# Patient Record
Sex: Male | Born: 1995 | Race: White | Hispanic: No | Marital: Single | Smoking: Never smoker
Health system: Southern US, Community
[De-identification: ages and names within clinical notes are randomized; demographics above are authoritative.]

## PROBLEM LIST (undated history)

## (undated) DIAGNOSIS — IMO0002 Reserved for concepts with insufficient information to code with codable children: Secondary | ICD-10-CM

## (undated) DIAGNOSIS — K219 Gastro-esophageal reflux disease without esophagitis: Secondary | ICD-10-CM

## (undated) DIAGNOSIS — I861 Scrotal varices: Secondary | ICD-10-CM

## (undated) DIAGNOSIS — L709 Acne, unspecified: Secondary | ICD-10-CM

## (undated) DIAGNOSIS — B019 Varicella without complication: Secondary | ICD-10-CM

## (undated) DIAGNOSIS — Q600 Renal agenesis, unilateral: Secondary | ICD-10-CM

## (undated) HISTORY — DX: Renal agenesis, unilateral: Q60.0

## (undated) HISTORY — DX: Reserved for concepts with insufficient information to code with codable children: IMO0002

## (undated) HISTORY — PX: TYMPANOSTOMY TUBE PLACEMENT: SHX32

## (undated) HISTORY — DX: Acne, unspecified: L70.9

## (undated) HISTORY — DX: Gastro-esophageal reflux disease without esophagitis: K21.9

## (undated) HISTORY — DX: Varicella without complication: B01.9

## (undated) HISTORY — DX: Scrotal varices: I86.1

## (undated) HISTORY — PX: KIDNEY SURGERY: SHX687

---
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
## 2017-03-31 ENCOUNTER — Ambulatory Visit (INDEPENDENT_AMBULATORY_CARE_PROVIDER_SITE_OTHER): Payer: BLUE CROSS/BLUE SHIELD | Admitting: *Deleted

## 2017-03-31 ENCOUNTER — Ambulatory Visit: Payer: BLUE CROSS/BLUE SHIELD

## 2017-03-31 DIAGNOSIS — Z23 Encounter for immunization: Secondary | ICD-10-CM

## 2018-02-24 ENCOUNTER — Ambulatory Visit (INDEPENDENT_AMBULATORY_CARE_PROVIDER_SITE_OTHER): Payer: BLUE CROSS/BLUE SHIELD

## 2018-02-24 ENCOUNTER — Encounter: Payer: Self-pay | Admitting: Family Medicine

## 2018-02-24 DIAGNOSIS — Z23 Encounter for immunization: Secondary | ICD-10-CM

## 2018-05-17 ENCOUNTER — Ambulatory Visit (INDEPENDENT_AMBULATORY_CARE_PROVIDER_SITE_OTHER): Payer: BLUE CROSS/BLUE SHIELD | Admitting: Family Medicine

## 2018-05-17 ENCOUNTER — Encounter: Payer: Self-pay | Admitting: Family Medicine

## 2018-05-17 VITALS — BP 118/82 | HR 92 | Temp 98.0°F | Ht 73.0 in | Wt 173.4 lb

## 2018-05-17 DIAGNOSIS — I861 Scrotal varices: Secondary | ICD-10-CM

## 2018-05-17 DIAGNOSIS — Q600 Renal agenesis, unilateral: Secondary | ICD-10-CM | POA: Diagnosis not present

## 2018-05-17 DIAGNOSIS — M545 Low back pain, unspecified: Secondary | ICD-10-CM

## 2018-05-17 DIAGNOSIS — Z0000 Encounter for general adult medical examination without abnormal findings: Secondary | ICD-10-CM

## 2018-05-17 DIAGNOSIS — G8929 Other chronic pain: Secondary | ICD-10-CM

## 2018-05-17 DIAGNOSIS — IMO0002 Reserved for concepts with insufficient information to code with codable children: Secondary | ICD-10-CM

## 2018-05-17 NOTE — Progress Notes (Signed)
Phone: [phone]  Subjective:  Patient presents today for their annual physical. Chief complaint-noted.   See problem oriented charting- ROS- full  review of systems was completed and negative except for: intermittent back pain radiating into lower legs  The following were reviewed and entered/updated in epic: Past Medical History:  Diagnosis Date  . Acne    06/06/14- moderate inflammatory started on doxycycline 163m BID, previously on adapalene topical retinoid 2014  . GERD (gastroesophageal reflux disease)    2008-2010 diagnosis.   . 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 did not play contact sports.   . Varicella    08/04/1998 per medical history  . Varicocele    L noted 2014. could affect fertility. Treatment is indicated for boys who demonstrate retarded growth of the affected (usually, left) testis and in young men who develop testicular atrophy   Patient Active Problem List   Diagnosis Date Noted  . Solitary kidney     Priority: Medium  . Acne     Priority: Low  . Varicocele     Priority: Low   Past Surgical History:  Procedure Laterality Date  . 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   . TYMPANOSTOMY TUBE PLACEMENT     Dr. BJanace Hoard6 months old    Family History  Problem Relation Age of Onset  . Melanoma Mother   . Ulcerative colitis Father   . Pancreatic disease Father        noncancerous    Medications- reviewed and updated No current outpatient medications on file.   No current facility-administered medications for this visit.     Allergies-reviewed and updated No Known Allergies  Social History   Social History Narrative   Family: Single. Lives at home with mom and dad. [facility] [employer] [employer] [facility] [employer]    Dad works for [employer]in 2020- did [employer] a year   MMudloggerand society degree under sociology.    Work: Goes to [employer].    Plays golf      Hobbies: fishing,  basketball with young life    Objective: BP 118/82 (BP Location: Left Arm, Patient Position: Sitting, Cuff Size: Large)   Pulse 92   Temp 98 F (36.7 C) (Oral)   Ht 6' 1"  (1.854 m)   Wt 173 lb 6.4 oz (78.7 kg)   SpO2 98%   BMI 22.88 kg/m  Gen: NAD, resting comfortably HEENT: Mucous membranes are moist. Oropharynx normal Neck: no thyromegaly CV: RRR no murmurs rubs or gallops Lungs: CTAB no crackles, wheeze, rhonchi Abdomen: soft/nontender/nondistended/normal bowel sounds. No rebound or guarding.  Ext: no edema Skin: warm, dry Neuro: grossly normal, moves all extremities, PERRLA GU: normal testicular exam, fuller left scrotum with known hydrocele Straight leg raise on both sides produces pain radiating into right leg   Assessment/Plan:  23y.o. male presenting for annual physical.  Health Maintenance counseling: 1. Anticipatory guidance: Patient counseled regarding regular dental exams -q6 months, eye exams -no issues,  avoiding smoking and second hand smoke, limiting alcohol to 2 beverages per day - technically not supposed to drink as underage- discussed safe usage.   2. Risk factor reduction:  Advised patient of need for regular exercise and diet rich and fruits and vegetables to reduce risk of heart attack and stroke. Exercise- has been down this fall but hopes to improve on that this new year. Diet-reasonably balance ddiet for being  in college.  Wt Readings from Last 3 Encounters:  05/17/18 173 lb 6.4 oz (78.7 kg)  01/04/15 162 lb (73.5 kg) (76 %, Z= 0.69)*  3. Immunizations/screenings/ancillary studies Immunization History  Administered Date(s) Administered  . DTaP 01/25/1998, 03/14/1998, 05/15/1998, 02/11/1999, 10/11/2002  . Hepatitis A 12/02/2006, 09/05/2007  . Hepatitis B 05/15/1998, 08/29/1998, 11/19/1998  . HiB (PRP-OMP) 01/25/1998, 03/14/1998, 02/11/1999  . IPV 01/25/1998, 03/14/1998, 08/29/1998, 10/11/2002  . Influenza,inj,Quad PF,6+ Mos 03/31/2017, 02/24/2018    . Influenza-Unspecified 04/10/2010  . MMR 11/19/1998, 10/11/2002  . Meningococcal Conjugate 06/11/2009, 01/04/2015  . Pneumococcal-Unspecified 05/23/1999, 11/06/1999  . Tdap 12/14/2008   Health Maintenance Due  Topic Date Due  . HIV Screening  11/03/2012  4. Prostate cancer screening- no family history, start at age 31 5. Colon cancer screening - no family history, start at age 51 6. Skin cancer screening/prevention-mother with melanoma history- he does not see dermatology-advised regular sunscreen use. Denies worrisome, changing, or new skin lesions.  7. Testicular cancer screening- advised monthly self exams - we also examined today 8. STD screening- patient opts out. Using protection every time he is active.  9.  Never smoker. Sparing marijuana in HS not in college. Drinks alcohol perhaps once a week. No drinking and driving.   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 Recommended updating labs- he declines but agrees to next year- cbc, cmp. Screen lipids next year. HIV screen next year.   Patient with known left varicocele- no obvious delayed testicular growth or testicular atrophy on exam today  Dealing with fair amount of back pain- plays a lot of golf. Started in left low back- he is a lefty. Felt it shooting down into left leg now in last 6 months can go down into both legs. Has gotten into a stretching regimen 20 mins he feels way better- resolution of pain if does full video for a week. He does a golf stretching video- uncle had the same thing and patient states almost immediately felt better. This morning woke up and had pain radiating into both legs though as has not been consistent over the break. With back pain considered HLA B27 given how young he is as well as lumbar films. Will defer for now- he agrees to see Dr. Paulla Fore  1 year CPE Declines labs today  Return  precautions advised.  Garret Reddish, MD

## 2018-05-17 NOTE — Patient Instructions (Addendum)
Back and GU exam   Please schedule a visit with our  sports medicine physician Dr. Berline Chough before you leave at the check out desk so he can further evaluate your back pain. I would like for you to sit down and chat with Dr. Berline Chough given chronicity of your pain. No rush on this but I would like his opinion. Try to be consistent with your stretching since that seems to help  You agreed to bloodwork next year- can go ahead and schedule a physical for next years Christmas break if you would like

## 2018-06-14 ENCOUNTER — Ambulatory Visit (INDEPENDENT_AMBULATORY_CARE_PROVIDER_SITE_OTHER): Payer: BLUE CROSS/BLUE SHIELD

## 2018-06-14 ENCOUNTER — Encounter: Payer: Self-pay | Admitting: Sports Medicine

## 2018-06-14 ENCOUNTER — Ambulatory Visit: Payer: BLUE CROSS/BLUE SHIELD | Admitting: Sports Medicine

## 2018-06-14 VITALS — BP 108/78 | HR 82 | Ht 75.0 in | Wt 171.6 lb

## 2018-06-14 DIAGNOSIS — M9908 Segmental and somatic dysfunction of rib cage: Secondary | ICD-10-CM | POA: Diagnosis not present

## 2018-06-14 DIAGNOSIS — M9903 Segmental and somatic dysfunction of lumbar region: Secondary | ICD-10-CM | POA: Diagnosis not present

## 2018-06-14 DIAGNOSIS — M24551 Contracture, right hip: Secondary | ICD-10-CM

## 2018-06-14 DIAGNOSIS — M5441 Lumbago with sciatica, right side: Secondary | ICD-10-CM

## 2018-06-14 DIAGNOSIS — R29898 Other symptoms and signs involving the musculoskeletal system: Secondary | ICD-10-CM | POA: Diagnosis not present

## 2018-06-14 DIAGNOSIS — G8929 Other chronic pain: Secondary | ICD-10-CM

## 2018-06-14 DIAGNOSIS — M9904 Segmental and somatic dysfunction of sacral region: Secondary | ICD-10-CM

## 2018-06-14 DIAGNOSIS — M9905 Segmental and somatic dysfunction of pelvic region: Secondary | ICD-10-CM

## 2018-06-14 DIAGNOSIS — M9902 Segmental and somatic dysfunction of thoracic region: Secondary | ICD-10-CM

## 2018-06-14 DIAGNOSIS — Q675 Congenital deformity of spine: Secondary | ICD-10-CM | POA: Diagnosis not present

## 2018-06-14 DIAGNOSIS — M9901 Segmental and somatic dysfunction of cervical region: Secondary | ICD-10-CM

## 2018-06-14 DIAGNOSIS — M5442 Lumbago with sciatica, left side: Secondary | ICD-10-CM | POA: Diagnosis not present

## 2018-06-14 DIAGNOSIS — M48061 Spinal stenosis, lumbar region without neurogenic claudication: Secondary | ICD-10-CM | POA: Diagnosis not present

## 2018-06-14 MED ORDER — GABAPENTIN 300 MG PO CAPS
ORAL_CAPSULE | ORAL | 1 refills | Status: DC
Start: 2018-06-14 — End: 2020-05-24

## 2018-06-14 NOTE — Patient Instructions (Addendum)
Also check out [employer]" which is a program developed by Dr. Myles Lipps.   There are links to a couple of his YouTube Videos below and I would like to see you performing one of his videos 5-6 days per week.  It is best to do these exercises first thing in the morning.  They will give you a good jumpstart here today and start normalizing the way you move.  A good intro video is: "Independence from Pain 7-minute Video" - [email or web address]   A more advanced video is: Scientist, research (medical) original 12 minutes" - [email or web address]  Exercises that focus more on the neck are as below: Dr. Derrill Kay with [employer] [facility] teaching neck and shoulder details Part 1 - https://[email or web address].[email or web address] Part 2 Dr. Derrill Kay with [facility] quick routine to practice daily - https://[email or web address].be/[email or web address]  Do not try to attempt the entire video when first beginning.  Try breaking of each exercise that he goes into shorter segments.  In other words, if they perform an exercise for 45 seconds, start with 15 seconds and rest and then resume when they begin the new activity.  If you work your way up to being able to do these videos without having to stop, I expect you will see significant improvements in your pain.  If you enjoy his videos and would like to find out more you can look on his website: [email or web address].  He has a workout streaming option as well as a DVD set available for purchase.  Amazon has the best price for his DVDs.     Please perform the exercise program that we have prepared for you and gone over in detail on a daily basis.  In addition to the handout you were provided you can access your program through: [email or web address]   Your unique program code is: [email or web address]

## 2018-06-14 NOTE — Progress Notes (Signed)
Brian Peters. Brian [facility] [phone]  Brian Peters - 23 y.o. male MRN [id number]  Date of birth: May 17, 1995  Visit Date: June 14, 2018  PCP: Shelva Majestic, MD   Referred by: Shelva Majestic, MD  SUBJECTIVE:  Chief Complaint  Patient presents with  . Lower Back - Initial Assessment    Golfer. Radiates to B LE. Improves with stretching.   . Initial Assessment    Referred by Dr. Tana Conch.     HPI: Patient presents with over a year of worsening left followed by right posterior leg and low back pain.  He has had symptoms that initially radiated from his left buttock into the left heel and now going from the right buttock to the right heel.  This is been on and off for over a year.  He has some nighttime disturbances due to this.  His symptoms are described as a sharp, constant pain.  He is an avid golfer is left-handed and reported playing golf almost daily over the summer.  He is in school at [employer] and plays 1-2 times a week during school year.  He has done some home exercise videos with his uncle who is an avid golfer as well.  The videos were golf specific and this did seem to help initially however unfortunately over the past 2 weeks he is actually had worsening of his symptoms and has a difficult time performing the videos due to the pain.  REVIEW OF SYSTEMS: Denies fevers, chills, recent weight gain or weight loss.  No night sweats. No significant nighttime awakenings due to this issue. Pt denies any change in bowel or bladder habits, muscle weakness, numbness or falls associated with this pain. Occasional nighttime awakening due to pain and discomfort but this is infrequent. Otherwise 12 point review of systems performed and is negative    HISTORY:  Prior history reviewed and updated per electronic medical record.  Patient Active Problem List   Diagnosis Date Noted  . 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 Acne     06/06/14- moderate inflammatory started on doxycycline 100mg  BID, previously on adapalene topical retinoid 2014   . Varicocele     L noted 2014. could affect fertility. Treatment is indicated for boys who demonstrate retarded growth of the affected (usually, left) testis and in young men who develop testicular atrophy   . Solitary kidney      did not play contact sports.   01/17/1998- "left ectopic nonfunctioning kidney withgrade IV vesicureteral reflux to the left kidney. Excision of ureterocele and left nephrectomy.   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     Social History   Occupational History  . Not on file  Tobacco Use  . Smoking status: Current Some Day Smoker    Types: E-cigarettes, Cigars  . Smokeless tobacco: Never Used  . Tobacco comment: socially  Substance and Sexual Activity  . Alcohol use: Yes    Alcohol/week: 0.0 standard drinks  . Drug use: No  . Sexual activity: Not on file   Social History   Social History Narrative   Family: Single. Lives at home with mom and dad. Sister at [employer].    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    Plays golf      Hobbies: fishing, basketball with young life    Past Medical History:  Diagnosis Date  . Acne    06/06/14- moderate inflammatory started on doxycycline 100mg  BID, previously on adapalene topical retinoid 2014  . GERD (gastroesophageal reflux disease)    2008-2010 diagnosis.   . 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 did not play contact sports.   . Varicella    08/04/1998 per medical history  . Varicocele    L noted 2014. could affect fertility. Treatment is indicated for boys who demonstrate retarded growth of the affected (usually, left) testis and in young men who develop  testicular atrophy     Past Surgical History:  Procedure Laterality Date  . 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   . TYMPANOSTOMY TUBE PLACEMENT     Dr. Jearld Fenton 6 months old    family history includes Melanoma in his mother; Pancreatic disease in his father; Ulcerative colitis in his father. No results for input(s): HGBA1C, LABURIC, CREATININE, CALCIUM, MG, AST, ALT, CKTOTAL, TSH in the last 8760 hours.  OBJECTIVE:  VS:  HT:6\' 3"  (190.5 cm)   WT:171 lb 9.6 oz (77.8 kg)  BMI:21.45    BP:108/78  HR:82bpm  TEMP: ( )  RESP:100 %   PHYSICAL EXAM: Well-developed, Well-nourished and In no acute distress  Pupils are equal., EOM intact without nystagmus. and No scleral icterus.  Alert & appropriately interactive. and Not depressed or anxious appearing.  Warm and well perfused   His back is well aligned however he has marked amount of bar lordosis.  He has a markedly positive straight leg raise on the right with a positive brokerage stretch test on the right.  Slightly positive contralateral left straight leg raise.  There is marked tightness and right hip compared to his left.  There is no significant weakness and is able to heel toe walk without difficulty.   ASSESSMENT:   1. Chronic bilateral low back pain with bilateral sciatica   2. Congenital anomaly of lumbar spine      PROCEDURES:  PROCEDURE NOTE : OSTEOPATHIC MANIPULATION The decision today to treat with Osteopathic Manipulative Therapy (OMT) was based on physical exam findings. Verbal consent was obtained following a discussion with the patient regarding the of risks, benefits and potential side effects, including an acute pain flare,post manipulation soreness and need for repeat treatments. Additionally, we specifically discussed the minimal risk of  injury to neurovascular structures associated with Cervical manipulation.   Contraindications to OMT: NONE  Manipulation was performed as  below: [facility] & Osteopathic Exam Findings  CERVICAL SPINE: OA - rotated right C6 Flexed, rotated RIGHT, sidebent LEFT THORACIC SPINE:  T6 - 9 Neutral, rotated RIGHT, sidebent LEFT RIBS:  Rib 7 Left  Posterior LUMBAR SPINE:  L4 FRS RIGHT (Flexed, Rotated & Sidebent) PELVIS:  Right psoas spasm Right anterior innonimate SACRUM:  L on L sacral torsion   OMT Techniques Used  HVLA muscle energy myofascial release HVLA - Long Lever   The patient tolerated the treatment well and reported Improved symptoms following treatment today. Patient was given medications, exercises, stretches and lifestyle modifications per AVS and verbally.    PROCEDURE NOTE: THERAPEUTIC EXERCISES (97110) 15 minutes spent for Therapeutic exercises as below and as referenced in the AVS.  This included exercises focusing on stretching, strengthening, with significant focus on eccentric  aspects.   Proper technique shown and discussed handout in great detail with ATC.  All questions were discussed and answered.   Long term goals include an improvement in range of motion, strength, endurance as well as avoiding reinjury. Frequency of visits is one time as determined during today's  office visit. Frequency of exercises to be performed is as per handout.  EXERCISES REVIEWED: Derrill KayGoodman Exercises Pelvic recruitment/tilting Hip ABduction strengthening with focus on Glute Medius Recruitment Hip flexor stretching        PLAN:  Pertinent additional documentation may be included in corresponding procedure notes, imaging studies, problem based documentation and patient instructions.  Congenital anomaly of lumbar spine This does cause a slight increased mobility in his lumbar spine and further stabilizing exercises will be of benefit as reviewed today.  Given the underlying anxiety as well as slight neural tension sign will benefit from gabapentin to allow increased physical activity.  Functional low back pain  with radicular symptoms consistent with anterior chain tightness and hypomobility of the lumbar spine which is exacerbated by the underlying congenital L6 vertebrae.  Links to [employer] Training videos provided today per Patient Instructions.  These exercises were developed by Myles LippsEric Goodman, DC with a strong emphasis on core neuromuscular reducation and postural realignment through body-weight exercises.  and Home Therapeutic exercises prescribed today per procedure note.  Discussed the underlying features of tight hip flexors leading to crouched, fetal like position that results in spinal column compression.  Including lumbar hyperflexion with hypermobility, thoracic flexion with restrictive rotation and cervical lordosis reversal.   Osteopathic manipulation was performed today based on physical exam findings.  Patient was counseled on the purpose and expected outcome of osteopathic manipulation and understands that a single treatment may not provide permanent long lasting relief.  They understand that home therapeutic exercises are critical part of the healing/treatment process and will continue with self treatment between now and their next visit as outlined.  The patient understands that the frequency of visits is meant to provide a stimulus to promote the body's own ability to heal and is not meant to be the sole means for improvement in their symptoms.  Activity modifications and the importance of avoiding exacerbating activities (limiting pain to no more than a 4 / 10 during or following activity) recommended and discussed.  Discussed red flag symptoms that warrant earlier emergent evaluation and patient voices understanding.   Meds ordered this encounter  Medications  . gabapentin (NEURONTIN) 300 MG capsule    Sig: Start with 1 tab po qhs X 1 week, then increase to 1 tab po bid X 1 week then 1 tab po tid prn    Dispense:  90 capsule    Refill:  1   Lab Orders  No laboratory test(s)  ordered today    Imaging Orders     DG Lumbar Spine Complete Referral Orders  No referral(s) requested today    At follow up will plan to consider: repeat osteopathic manipulation   Return in about 3 weeks (around 07/05/2018).          Andrena MewsMichael D Kaevon Cotta, DO    [place] [facility] Physician

## 2018-06-14 NOTE — Assessment & Plan Note (Signed)
This does cause a slight increased mobility in his lumbar spine and further stabilizing exercises will be of benefit as reviewed today.

## 2018-07-05 ENCOUNTER — Encounter: Payer: Self-pay | Admitting: Sports Medicine

## 2018-07-05 ENCOUNTER — Ambulatory Visit: Payer: BLUE CROSS/BLUE SHIELD | Admitting: Sports Medicine

## 2018-07-05 VITALS — BP 112/78 | HR 82 | Ht 75.0 in | Wt 170.4 lb

## 2018-07-05 DIAGNOSIS — M24551 Contracture, right hip: Secondary | ICD-10-CM | POA: Diagnosis not present

## 2018-07-05 DIAGNOSIS — Q675 Congenital deformity of spine: Secondary | ICD-10-CM

## 2018-07-05 DIAGNOSIS — R29898 Other symptoms and signs involving the musculoskeletal system: Secondary | ICD-10-CM | POA: Diagnosis not present

## 2018-07-05 DIAGNOSIS — M9908 Segmental and somatic dysfunction of rib cage: Secondary | ICD-10-CM

## 2018-07-05 DIAGNOSIS — G8929 Other chronic pain: Secondary | ICD-10-CM

## 2018-07-05 DIAGNOSIS — M5442 Lumbago with sciatica, left side: Secondary | ICD-10-CM

## 2018-07-05 DIAGNOSIS — M9902 Segmental and somatic dysfunction of thoracic region: Secondary | ICD-10-CM

## 2018-07-05 DIAGNOSIS — M9903 Segmental and somatic dysfunction of lumbar region: Secondary | ICD-10-CM

## 2018-07-05 DIAGNOSIS — M5441 Lumbago with sciatica, right side: Secondary | ICD-10-CM

## 2018-07-05 DIAGNOSIS — M9901 Segmental and somatic dysfunction of cervical region: Secondary | ICD-10-CM

## 2018-07-05 DIAGNOSIS — M9905 Segmental and somatic dysfunction of pelvic region: Secondary | ICD-10-CM

## 2018-07-05 NOTE — Progress Notes (Signed)
Brian Peters. Brian Peters [facility] [phone]  Brian Peters - 23 y.o. male MRN [id number]  Date of birth: 12-18-1995  Visit Date: July 05, 2018  PCP: Shelva Majestic, MD   Referred by: Shelva Majestic, MD  SUBJECTIVE:  Chief Complaint  Patient presents with  . Lower Back - Follow-up  . Right Hip - Follow-up    HPI: Patient is here for follow-up of the above symptoms.  Ultimately he is having marked improvements in symptoms he was even able to go play 3 days of golf and [address] [place] without any back pain.  He is having occasional sharp pain that comes and goes.  Does seem to be located in the right lower extremity including into the quad and groin when he is noticing pain.  The gabapentin 300 mg she is taking twice daily up to 3 times daily.  He is performing the [employer] daily as well as the home exercise program previously prescribed diligently.  Overall he is very happy with his progress.   REVIEW OF SYSTEMS: No significant nighttime awakenings due to this issue. Denies fevers, chills, recent weight gain or weight loss.  No night sweats.  Pt denies any change in bowel or bladder habits, muscle weakness, numbness or falls associated with this pain.  HISTORY:  Prior history reviewed and updated per electronic medical record.  Patient Active Problem List   Diagnosis Date Noted  . 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 Acne     06/06/14- moderate inflammatory started on doxycycline 100mg  BID, previously on adapalene topical retinoid 2014   . Varicocele     L noted 2014. could affect fertility. Treatment is indicated for boys who demonstrate retarded growth of the affected (usually, left) testis and in young men who develop testicular atrophy   . Solitary kidney      did not play contact sports.   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 Excision of ureterocele and left nephrectomy.   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     Social History   Occupational History  . Not on file  Tobacco Use  . Smoking status: Current Some Day Smoker    Types: E-cigarettes, Cigars  . Smokeless tobacco: Never Used  . Tobacco comment: socially  Substance and Sexual Activity  . Alcohol use: Yes    Alcohol/week: 0.0 standard drinks  . Drug use: No  . Sexual activity: Not on file   Social History   Social History Narrative   Family: Single. Lives at home with mom and dad. Sister at [facility].    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    Plays golf      Hobbies: fishing, basketball with young life    OBJECTIVE:  VS:  HT:6\' 3"  (190.5 cm)   WT:170 lb 6.4 oz (77.3 kg)  BMI:21.3    BP:112/78  HR:82bpm  TEMP: ( )  RESP:98 %   PHYSICAL EXAM: Adult male. No acute distress.  Alert and appropriate. Good cervical and lumbar range of motion although there are functional limitations. Negative straight leg raise. Upper extremity lower extremity strength is 5/5 in all myotomes.  Normal sensation Significant anterior chain dominant posture.    ASSESSMENT:   1. Chronic bilateral low back pain with bilateral sciatica   2. Congenital anomaly of lumbar spine   3. Weakness of right hip   4. Right hip flexor tightness   5. Somatic dysfunction of cervical region   6. Somatic dysfunction of thoracic region   7. Somatic dysfunction of lumbar region   8. Somatic dysfunction of rib cage region   9. Somatic dysfunction of pelvis region     PROCEDURES:  PROCEDURE NOTE : OSTEOPATHIC MANIPULATION The decision today to treat with Osteopathic Manipulative Therapy (OMT) was based on physical exam findings. Verbal consent was obtained following a  discussion with the patient regarding the of risks, benefits and potential side effects, including an acute pain flare,post manipulation soreness and need for repeat treatments. Minimal risk of injury to neurovascular structures with cervical manipulation previously discussed in detailed and reaffirmed today.   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 Patient was given medications, exercises, stretches and lifestyle modifications per AVS and verbally.     PLAN:  Pertinent additional documentation may be included in corresponding procedure notes, imaging studies, problem based documentation and patient instructions.  No problem-specific Assessment & Plan notes found for this encounter.   Overall great improvement.  He will continue with the gabapentin at this time until the end of the semester.  Hopefully will be able to wean him off of this once his symptoms are more stable.  He does report feeling markedly better overall.  Continue previously prescribed home exercise program.   Discussed the underlying features of tight hip flexors leading to crouched, fetal like position that results in spinal column compression.  Including lumbar hyperflexion with hypermobility, thoracic flexion with restrictive rotation and cervical lordosis reversal.   Osteopathic manipulation was performed today based on physical  exam findings.  Patient has responded well to osteopathic manipulation previously the prior manipulation did not provide permanent long lasting relief.  The patient does feel as though there was significant benefit to the prior manipulation and they wished for repeat manipulation today.  They understand that home therapeutic exercises are critical part of the healing/treatment process and will continue with self treatment between now and their next visit as outlined.  The patient understands that the frequency of visits is meant to provide a stimulus to promote the body's own ability to heal and is not meant to be the sole means for improvement in their symptoms.  Activity modifications and the importance of avoiding exacerbating activities (limiting pain to no more than a 4 / 10 during or following activity) recommended and discussed.  Discussed red flag symptoms that warrant earlier emergent evaluation and patient voices understanding.   No orders of the defined types were placed in this encounter.  Lab Orders  No laboratory test(s) ordered today   Imaging Orders  No imaging studies ordered today  Referral Orders  No referral(s) requested today    At follow up will plan to consider: repeat osteopathic manipulation  Return in about 8 weeks (around 08/30/2018) for consideration of repeat Osteopathic Manipulation.          Andrena Mews, DO    [facility]

## 2018-07-07 ENCOUNTER — Ambulatory Visit: Payer: BLUE CROSS/BLUE SHIELD | Admitting: Family Medicine

## 2018-07-07 ENCOUNTER — Encounter: Payer: Self-pay | Admitting: Family Medicine

## 2018-07-07 VITALS — BP 90/70 | HR 71 | Temp 97.8°F | Ht 75.0 in | Wt 171.0 lb

## 2018-07-07 DIAGNOSIS — Q600 Renal agenesis, unilateral: Secondary | ICD-10-CM | POA: Diagnosis not present

## 2018-07-07 DIAGNOSIS — IMO0002 Reserved for concepts with insufficient information to code with codable children: Secondary | ICD-10-CM

## 2018-07-07 DIAGNOSIS — Z13220 Encounter for screening for lipoid disorders: Secondary | ICD-10-CM | POA: Diagnosis not present

## 2018-07-07 DIAGNOSIS — Z114 Encounter for screening for human immunodeficiency virus [HIV]: Secondary | ICD-10-CM

## 2018-07-07 DIAGNOSIS — N529 Male erectile dysfunction, unspecified: Secondary | ICD-10-CM

## 2018-07-07 DIAGNOSIS — I861 Scrotal varices: Secondary | ICD-10-CM

## 2018-07-07 LAB — COMPREHENSIVE METABOLIC PANEL
ALT: 12 U/L (ref 0–53)
AST: 15 U/L (ref 0–37)
Albumin: 5.1 g/dL (ref 3.5–5.2)
Alkaline Phosphatase: 59 U/L (ref 39–117)
BUN: 17 mg/dL (ref 6–23)
CALCIUM: 10 mg/dL (ref 8.4–10.5)
CHLORIDE: 101 meq/L (ref 96–112)
CO2: 27 meq/L (ref 19–32)
CREATININE: 1.07 mg/dL (ref 0.40–1.50)
GFR: 87.52 mL/min (ref >60.00)
Glucose, Bld: 90 mg/dL (ref 70–99)
Potassium: 4 mEq/L (ref 3.5–5.1)
SODIUM: 137 meq/L (ref 135–145)
Total Bilirubin: 0.8 mg/dL (ref 0.2–1.2)
Total Protein: 7.8 g/dL (ref 6.0–8.3)

## 2018-07-07 LAB — LIPID PANEL
CHOL/HDL RATIO: 2
Cholesterol: 162 mg/dL (ref 0–200)
HDL: 68.4 mg/dL (ref >39.00)
LDL CALC: 70 mg/dL (ref 0–99)
NonHDL: 93.78
TRIGLYCERIDES: 117 mg/dL (ref 0.0–149.0)
VLDL: 23.4 mg/dL (ref 0.0–40.0)

## 2018-07-07 LAB — CBC
HCT: 46.2 % (ref 39.0–52.0)
Hemoglobin: 16.1 g/dL (ref 13.0–17.0)
MCHC: 34.8 g/dL (ref 30.0–36.0)
MCV: 83.8 fl (ref 78.0–100.0)
PLATELETS: 161 10*3/uL (ref 150.0–400.0)
RBC: 5.52 Mil/uL (ref 4.22–5.81)
RDW: 13.5 % (ref 11.5–14.6)
WBC: 3.4 10*3/uL — ABNORMAL LOW (ref 4.5–10.5)

## 2018-07-07 NOTE — Progress Notes (Signed)
Phone [phone]   Subjective:  Brian Peters is a 23 y.o. year old very pleasant male patient who presents for/with See problem oriented charting ROS- normal libido, softer and more difficult to achieve erections . No abdominal pain, chest pain, shortness of breath.   Past Medical History-  Patient Active Problem List   Diagnosis Date Noted  . Solitary kidney     Priority: Medium  . Acne     Priority: Low  . Varicocele     Priority: Low  . Congenital anomaly of lumbar spine 06/14/2018    Medications- reviewed and updated Current Outpatient Medications  Medication Sig Dispense Refill  . gabapentin (NEURONTIN) 300 MG capsule Start with 1 tab po qhs X 1 week, then increase to 1 tab po bid X 1 week then 1 tab po tid prn 90 capsule 1   No current facility-administered medications for this visit.      Objective:  BP 90/70 (BP Location: Left Arm, Patient Position: Sitting, Cuff Size: Normal)   Pulse 71   Temp 97.8 F (36.6 C) (Oral)   Ht 6' 3"  (1.905 m)   Wt 171 lb (77.6 kg)   SpO2 97%   BMI 21.37 kg/m  Gen: NAD, resting comfortably CV: RRR no murmurs rubs or gallops Lungs: CTAB no crackles, wheeze, rhonchi Abdomen: soft/nontender/nondistended/normal bowel sounds.  Ext: no edema Skin: warm, dry Did not reexamine scrotum today    Assessment and Plan   Back pain S: On last visit " Dealing with fair amount of back pain- plays a lot of golf. Started in left low back- he is a lefty. Felt it shooting down into left leg now in last 6 months can go down into both legs. Has gotten into a stretching regimen 20 mins he feels way better- resolution of pain if does full video for a week. He does a golf stretching video- uncle had the same thing and patient states almost immediately felt better. This morning woke up and had pain radiating into both legs though as has not been consistent over the break. With back pain considered HLA B27 given how young he is as well as lumbar  films. Will defer for now- he agrees to see Dr. Paulla Fore "  He has seen Dr. Paulla Fore twice and had x-rays "Scoliosis with mild disc space narrowing at L5-6 and L6-S1. No fracture or spondylolisthesis."  He states he is doing well with gabapentin for nerve irriation/damage. Has done really well with manipulation. The chronic pain has significantly improved. Still with occasional issues if pushes hard in sports or stretching. Trying to stay on stretching routine and has 8 week follow up.  A/P: glad he is improving- continue follow up with Dr. Paulla Fore - opted out of hla b27 with labs   Erectile dysfunction Varicocele S: feels like left testicle is floating- doesn't hang as low as the right. This really is not a new thing for him but following issues have made him more concerned...  He states he has not been able to get an erection on command like he previously could. If does get an erection it won't be a full one (some softness). Issues when dating and masturbation. Started shortly after visit in January. Having some morning erections but not as frequent and not nearly as hard as previous. Has been able to ejaculate but is more difficult.   Energy levels normal. Libido is normal.  A/P: Patient denies recent stressors- that honestly is one of the more common  reasons I see for ED in young men. Symptoms started after we discussed varicocele last visit- I suspect this may have caused unconscious mental strain. He is worried about testosterone issues and with less frequent morning erections I think its reasonable to check levels.   We reviewed varicocele could cause low testosterone but usually not low enough to cause significant issues- if his levels are low would refer to urology   Future Appointments  Date Time Provider [place]  05/19/2019  2:20 PM Marin Olp, MD [facility]   Lab/Order associations: fasting- updating labs as previously planned. Labs likely to occur around 9 20 so shortly  after desired for testosterone Solitary kidney - Plan: CBC, Comprehensive metabolic panel  Screening for hyperlipidemia - Plan: Lipid panel  Screening for HIV (human immunodeficiency virus) - Plan: HIV Antibody (routine testing w rflx)  Erectile dysfunction, unspecified erectile dysfunction type - Plan: Testos,Total,Free and SHBG (Male)  Return precautions advised.  Garret Reddish, MD

## 2018-07-07 NOTE — Patient Instructions (Addendum)
Please stop by lab before you go If you do not have mychart- we will call you about results within 5 business days of [place] receiving them.  If you have mychart- we will send your results within 3 business days of [place] receiving them.  If abnormal or we want to clarify a result, we will call or mychart you to make sure you receive the message.  If you have questions or concerns or don't hear within 5-7 days, please send [place] a message or call us.    No changes planned today unless testosterone is low- if it is would likely do a confirmatory test then consider endocrinology- later changed to urology referral

## 2018-07-10 LAB — HIV ANTIBODY (ROUTINE TESTING W REFLEX): HIV: NONREACTIVE

## 2018-07-10 LAB — TESTOS,TOTAL,FREE AND SHBG (FEMALE)
Free Testosterone: 106.4 pg/mL (ref 35.0–155.0)
Sex Hormone Binding: 25 nmol/L (ref 10–50)
TESTOSTERONE, TOTAL, LC-MS-MS: 514 ng/dL (ref 250–1100)

## 2018-07-13 ENCOUNTER — Encounter: Payer: Self-pay | Admitting: Family Medicine

## 2018-07-20 ENCOUNTER — Telehealth: Payer: Self-pay | Admitting: Family Medicine

## 2018-07-20 NOTE — Telephone Encounter (Signed)
Pt called to get results.  Copied from CRM [id number]. Topic: Quick Communication - Lab Results (Clinic Use ONLY) >> Jul 13, 2018  3:55 PM Sherrin Daisy, CMA wrote: Called patient to inform them of  lab results. When patient returns call, triage nurse may disclose results.

## 2018-07-20 NOTE — Telephone Encounter (Signed)
Pt given lab results and documented in result note.  

## 2019-02-13 ENCOUNTER — Other Ambulatory Visit: Payer: Self-pay

## 2019-02-13 DIAGNOSIS — Z20822 Contact with and (suspected) exposure to covid-19: Secondary | ICD-10-CM

## 2019-02-15 LAB — NOVEL CORONAVIRUS, NAA: SARS-CoV-2, NAA: NOT DETECTED

## 2019-02-28 DIAGNOSIS — U071 COVID-19: Secondary | ICD-10-CM

## 2019-02-28 HISTORY — DX: COVID-19: U07.1

## 2019-05-18 ENCOUNTER — Other Ambulatory Visit: Payer: Self-pay

## 2019-05-19 ENCOUNTER — Encounter: Payer: Self-pay | Admitting: Family Medicine

## 2019-05-19 ENCOUNTER — Ambulatory Visit (INDEPENDENT_AMBULATORY_CARE_PROVIDER_SITE_OTHER): Payer: 59 | Admitting: Family Medicine

## 2019-05-19 ENCOUNTER — Encounter: Payer: BLUE CROSS/BLUE SHIELD | Admitting: Family Medicine

## 2019-05-19 VITALS — BP 118/68 | HR 76 | Temp 98.0°F | Ht 74.0 in | Wt 177.0 lb

## 2019-05-19 DIAGNOSIS — IMO0002 Reserved for concepts with insufficient information to code with codable children: Secondary | ICD-10-CM

## 2019-05-19 DIAGNOSIS — Z0000 Encounter for general adult medical examination without abnormal findings: Secondary | ICD-10-CM

## 2019-05-19 DIAGNOSIS — Q600 Renal agenesis, unilateral: Secondary | ICD-10-CM | POA: Diagnosis not present

## 2019-05-19 NOTE — Patient Instructions (Addendum)
Health Maintenance Due  Topic Date Due  . TETANUS/TDAP - declines today. If he gets a cut/scrape/bee sting- will come in for tetanus shot 12/15/2018   I think getting an updated dermatology visit given mom's history is reasonable- you wanted to chat with parents before doing this.   Recommended follow up: Return in about 1 year (around 05/18/2020) for physical or sooner if needed. we will update labs again at that time due to only having one kidney

## 2019-05-19 NOTE — Progress Notes (Signed)
Phone: [phone]    Subjective:  Patient presents today for their annual physical. Chief complaint-noted.   See problem oriented charting- Review of Systems  Constitutional: Negative.   HENT: Negative.   Eyes: Negative.   Respiratory: Negative.   Cardiovascular: Negative.   Gastrointestinal: Negative.   Genitourinary: Negative.   Musculoskeletal: Negative.   Skin: Negative.   Neurological: Negative.   Endo/Heme/Allergies: Negative.   Psychiatric/Behavioral: Negative.   back pain better  The following were reviewed and entered/updated in epic: Past Medical History:  Diagnosis Date  . Acne    06/06/14- moderate inflammatory started on doxycycline 123m BID, previously on adapalene topical retinoid 2014  . COVID-19 02/28/2019  . GERD (gastroesophageal reflux disease)    2008-2010 diagnosis.   . 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 did not play contact sports.   . Varicella    08/04/1998 per medical history  . Varicocele    L noted 2014. could affect fertility. Treatment is indicated for boys who demonstrate retarded growth of the affected (usually, left) testis and in young men who develop testicular atrophy   Patient Active Problem List   Diagnosis Date Noted  . Solitary kidney     Priority: Medium  . Acne     Priority: Low  . Varicocele     Priority: Low  . 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   . TYMPANOSTOMY TUBE PLACEMENT     Dr. BJanace Hoard6 months old    Family History  Problem Relation Age of Onset  . Melanoma Mother   . Ulcerative colitis Father   . Pancreatic disease Father        noncancerous    Medications- reviewed and updated Current Outpatient Medications  Medication Sig Dispense Refill  . gabapentin (NEURONTIN) 300 MG capsule Start with 1 tab po qhs X 1 week, then increase to 1 tab po bid X 1 week then 1 tab po  tid prn (Patient not taking: Reported on 05/19/2019) 90 capsule 1   No current facility-administered medications for this visit.    Allergies-reviewed and updated No Known Allergies  Social History   Social History Narrative   Family: Single. Lives at home with mom and dad. [place]     Dad works for [employer] golf - patient interning as below       [employer] 2021- did [employer] a year   Management and society degree under sociology.    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    - had gone to page.       Hobbies: fishing, golf      Objective:  BP 118/68 (BP Location: Left Arm, Patient Position: Sitting)   Pulse 76   Temp 98 F (36.7 C)   Ht 6' 2"  (1.88 m)   Wt 177 lb (80.3 kg)   SpO2 98%   BMI 22.73 kg/m  Gen: NAD, resting comfortably HEENT: Mask not removed due to covid 19. TM normal. Bridge of nose normal. Eyelids normal.  Neck: no thyromegaly or cervical lymphadenopathy  CV: RRR no murmurs rubs or gallops Lungs: CTAB no crackles, wheeze, rhonchi Abdomen: soft/nontender/nondistended/normal bowel sounds. No rebound or guarding.  Ext: no edema Skin: warm, dry Neuro: grossly normal, moves all extremities, PERRLA    Assessment and Plan:  24 y.o. male presenting for annual physical.  Health Maintenance counseling: 1. Anticipatory guidance: Patient counseled regarding regular dental exams q6 months, eye exams- no issues, Patient states that he avoids smoking and second hand smoke when he can,; imiting alcohol to 2 beverages per day.  2-5 per week alcoholic bevaerages.  2. Risk factor reduction:  Advised patient of need for regular exercise and diet rich and fruits and vegetables to reduce risk of heart attack and stroke. Patient exercises daily, including golfing and daily workouts. Diet-Patient has a well balanced diet.  Wt Readings from Last 3 Encounters:  05/19/19 177 lb (80.3 kg)  07/07/18 171 lb  (77.6 kg)  07/05/18 170 lb 6.4 oz (77.3 kg)  3. Immunizations/screenings/ancillary studies- tdap today  Immunization History  Administered Date(s) Administered  . DTaP 01/25/1998, 03/14/1998, 05/15/1998, 02/11/1999, 10/11/2002  . Hepatitis A 12/02/2006, 09/05/2007  . Hepatitis B 05/15/1998, 08/29/1998, 11/19/1998  . HiB (PRP-OMP) 01/25/1998, 03/14/1998, 02/11/1999  . IPV 01/25/1998, 03/14/1998, 08/29/1998, 10/11/2002  . Influenza,inj,Quad PF,6+ Mos 03/31/2017, 02/24/2018  . Influenza-Unspecified 04/10/2010  . MMR 11/19/1998, 10/11/2002  . Meningococcal Conjugate 06/11/2009, 01/04/2015  . Pneumococcal-Unspecified 05/23/1999, 11/06/1999  . Tdap 12/14/2008  4. Prostate cancer screening- no family history, start age 101  5. Colon cancer screening - no family history, start age 53. Consider early if any signs of ulcerative colitis (father with this)  47. Skin cancer screening/prevention- no dermatologist (mom had melanoma though so advised to consider- has done years ago- encouraged to schedule another). advised regular sunscreen use. Denies worrisome, changing, or new skin lesions.  7. Testicular cancer screening- advised monthly self exams . Known varicocele 8. STD screening- patient opts out- monogamous with GF but she has latex allergy- encouraged latex free condom. Declines for now 9. Current some day smoker- sparing cigar- encouraged against  Status of chronic or acute concerns   Solitary kidney- left ectopic nonfunctioning kidney due to grade 4 vesicoureteral reflux. Update cbc, cmp, ua today. Screen lipids as well. Advised avoid nsaids- advised tylenol as needed for aches/pains headaches.   Known left varicocele- no testicular atrophy per patient.   Congenital anomaly of lumbar spine- appears to be scoliosis on imaging and mild disc space narrowing L5-l6, l6-s1. Gabapentin was helpful for pain at the time- stretching videos with Dr. Paulla Fore were super helpful- no recent issues.    october 2020 had covid 19- thinks got it in [place] not being careful. Has felt like has a mild fog at times since then.   Recommended follow up: Return in about 1 year (around 05/18/2020) for physical or sooner if needed.  Lab/Order associations: not fasting   ICD-10-CM   1. Preventative health care  Z00.00   2. Solitary kidney  Q60.0    Return precautions advised.   Garret Reddish, MD

## 2020-05-22 NOTE — Progress Notes (Signed)
Phone: [phone]    Subjective:  Patient presents today for their annual physical. Chief complaint-noted.   See problem oriented charting- ROS- full  review of systems was completed and negative per full ROS sheet  The following were reviewed and entered/updated in epic: Past Medical History:  Diagnosis Date  . Acne    06/06/14- moderate inflammatory started on doxycycline 134m BID, previously on adapalene topical retinoid 2014  . COVID-19 02/28/2019  . GERD (gastroesophageal reflux disease)    2008-2010 diagnosis.   . 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 did not play contact sports.   . Varicella    08/04/1998 per medical history  . Varicocele    L noted 2014. could affect fertility. Treatment is indicated for boys who demonstrate retarded growth of the affected (usually, left) testis and in young men who develop testicular atrophy   Patient Active Problem List   Diagnosis Date Noted  . Solitary kidney, acquired     Priority: Medium  . Acne     Priority: Low  . Varicocele     Priority: Low  . 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   . TYMPANOSTOMY TUBE PLACEMENT     Dr. BJanace Hoard6 months old    Family History  Problem Relation Age of Onset  . Melanoma Mother   . Ulcerative colitis Father   . Pancreatic disease Father        noncancerous    Medications- reviewed and updated No current outpatient medications on file.   No current facility-administered medications for this visit.    Allergies-reviewed and updated No Known Allergies  Social History   Social History Narrative   Family: Single lives with roommate (cousin).  [place]. Sister [place]        [place] now [employer]. Remote golf. Dad exec in [place] 2021- - did  [employer] for a year   Management and society degree under sociology.    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    - had gone to page.       Hobbies: fishing, golf      Objective:  BP 118/80   Pulse 83   Temp 98 F (36.7 C) (Temporal)   Resp 18   Ht _0  (1.88 m)   Wt 165 lb 6.4 oz (75 kg)   SpO2 98%   BMI 21.24 kg/m  Gen: NAD, resting comfortably HEENT: Mucous membranes are moist. Oropharynx normal Neck: no thyromegaly CV: RRR no murmurs rubs or gallops Lungs: CTAB no crackles, wheeze, rhonchi Abdomen: soft/nontender/nondistended/normal bowel sounds. No rebound or guarding.  Ext: no edema Skin: warm, dry Neuro: grossly normal, moves all extremities, PERRLA    Assessment and Plan:  25y.o. male presenting for annual physical.  Health Maintenance counseling: 1. Anticipatory guidance: Patient counseled regarding regular dental exams -q6 months, eye exams -declines any issues,  avoiding smoking and second hand smoke- other than occasional cigars but has cut down , limiting alcohol to 2 beverages per dHRC-1-6alcoholic beverages per week.   2. Risk factor reduction:  Advised patient of need for regular exercise and diet rich and fruits and vegetables to reduce risk of heart attack and stroke. Exercise- exercises  daily at gym- cardio and lifting weights- cardio for 30 and weights for 30. Diet-well-balanced diet- improved from last year- more veggies/fruits/greens Wt Readings from Last 3 Encounters:  05/24/20 165 lb 6.4 oz (75 kg)  05/19/19 177 lb (80.3 kg)  07/07/18 171 lb (77.6 kg)  3. Immunizations/screenings/ancillary studies-flu shot declined.  Tdap discussed- declines.  Discussed one-time lifetime hepatitis C screening- will consider next year.  Patient had COVID-19 in October 2020 and again in January-  He opts to hold off on booster for J+J since just had covid again and likely boost in natural immunity Immunization History   Administered Date(s) Administered  . DTaP 01/25/1998, 03/14/1998, 05/15/1998, 02/11/1999, 10/11/2002  . Hepatitis A 12/02/2006, 09/05/2007  . Hepatitis B 05/15/1998, 08/29/1998, 11/19/1998  . HiB (PRP-OMP) 01/25/1998, 03/14/1998, 02/11/1999  . IPV 01/25/1998, 03/14/1998, 08/29/1998, 10/11/2002  . Influenza,inj,Quad PF,6+ Mos 03/31/2017, 02/24/2018  . Influenza-Unspecified 04/10/2010  . Janssen (J&J) SARS-COV-2 Vaccination 08/21/2019  . MMR 11/19/1998, 10/11/2002  . Meningococcal Conjugate 06/11/2009, 01/04/2015  . Pneumococcal-Unspecified 05/23/1999, 11/06/1999  . Tdap 12/14/2008   4. Prostate cancer screening-   No family history, start at age 63  5. Colon cancer screening -  no family history, start at age 9.  Patient's father with ulcerative colitis but patient has had no signs 6. Skin cancer screening/prevention-melanoma in mother and I have encouraged patient to consider seeing dermatology at least intermittently-has been several years- he agrees to try to call and get set up. advised regular sunscreen use. Denies worrisome, changing, or new skin lesions.  7. Testicular cancer screening- advised monthly self exams - no changes 8. STD screening- patient opts out-monogamous with girlfriend.  Have discussed latex free condoms for gf with latex allergy. She is on birth control 9. Some day smoker-was smoking some last year with sparing cigar. Has decreased- encouraged cessation  Status of chronic or acute concerns   #Solitary kidney-left ectopic nonfunctioning kidney due to grade 4 vesicoureteral reflux.  Update CBC, CMP, UA  Planned- patient declines- will do this next year   # got omicron January 2021- less severe but lasted longer than October 2020 infection  #Screening for hyperlipidemia S: Medication:None Lab Results  Component Value Date   CHOL 162 07/07/2018   HDL 68.40 07/07/2018   LDLCALC 70 07/07/2018   TRIG 117.0 07/07/2018   CHOLHDL 2 07/07/2018   A/P: Lipids normal  in 2020-recheck every 5 years  #Known left varicocele- no testicular atrophy per patient.  Does not want to pursue surgical treatment   #Congenital abnormality of lumbar spine- prior imaging with possible scoliosis and mild disc space narrowing L5-L6 and L6-S1.  Has had pain in the past and gabapentin has been helpful.  Has had visit with Dr. Paulla Fore and stretching exercises were very helpful. - no issues and not even doing his exercises.   Recommended follow up: Return in about 1 year (around 05/24/2021) for physical or sooner if needed.  Lab/Order associations: no labs today   ICD-10-CM   1. Preventative health care  Z00.00   2. Solitary kidney, acquired  Z90.5   3. Encounter for hepatitis C screening test for low risk patient  Z11.59    No orders of the defined types were placed in this encounter.   Return precautions advised.   Garret Reddish, MD

## 2020-05-22 NOTE — Patient Instructions (Incomplete)
We agreed to bloodwork next year  Health Maintenance Due  Topic Date Due  . TETANUS/TDAP - today 12/15/2018  . INFLUENZA VACCINE - declined 12/10/2019    Ask parents who they see- I think a dermatology updated exam would be great. Happy to place referral if needed but usually not needed for private

## 2020-05-23 ENCOUNTER — Other Ambulatory Visit: Payer: Self-pay

## 2020-05-24 ENCOUNTER — Ambulatory Visit (INDEPENDENT_AMBULATORY_CARE_PROVIDER_SITE_OTHER): Payer: 59 | Admitting: Family Medicine

## 2020-05-24 ENCOUNTER — Encounter: Payer: Self-pay | Admitting: Family Medicine

## 2020-05-24 VITALS — BP 118/80 | HR 83 | Temp 98.0°F | Resp 18 | Ht 74.0 in | Wt 165.4 lb

## 2020-05-24 DIAGNOSIS — Z1159 Encounter for screening for other viral diseases: Secondary | ICD-10-CM | POA: Diagnosis not present

## 2020-05-24 DIAGNOSIS — Z13220 Encounter for screening for lipoid disorders: Secondary | ICD-10-CM

## 2020-05-24 DIAGNOSIS — Z0000 Encounter for general adult medical examination without abnormal findings: Secondary | ICD-10-CM | POA: Diagnosis not present

## 2020-05-24 DIAGNOSIS — Z23 Encounter for immunization: Secondary | ICD-10-CM

## 2020-05-24 DIAGNOSIS — Z905 Acquired absence of kidney: Secondary | ICD-10-CM

## 2020-05-24 NOTE — Addendum Note (Signed)
Addended by: Daryll Brod on: 05/24/2020 10:11 AM   Modules accepted: Orders

## 2020-11-07 IMAGING — DX DG LUMBAR SPINE COMPLETE 4+V
5 series · 5 of 5 positions shown · non-contrast
Comparison: None.

CLINICAL DATA: Chronic lumbago with bilateral radicular symptoms

EXAM:
LUMBAR SPINE - COMPLETE 4+ VIEW

[lumbar spine ap]
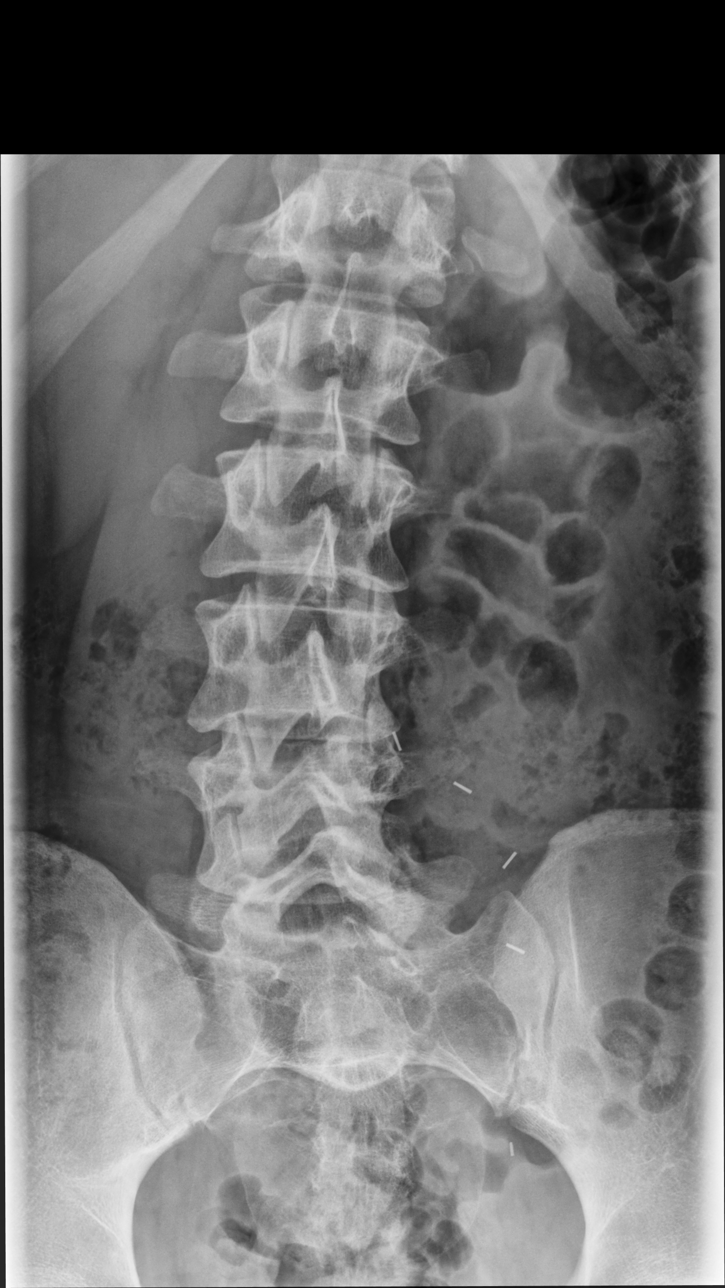

[lumbar spine oblique (1 of 2)]
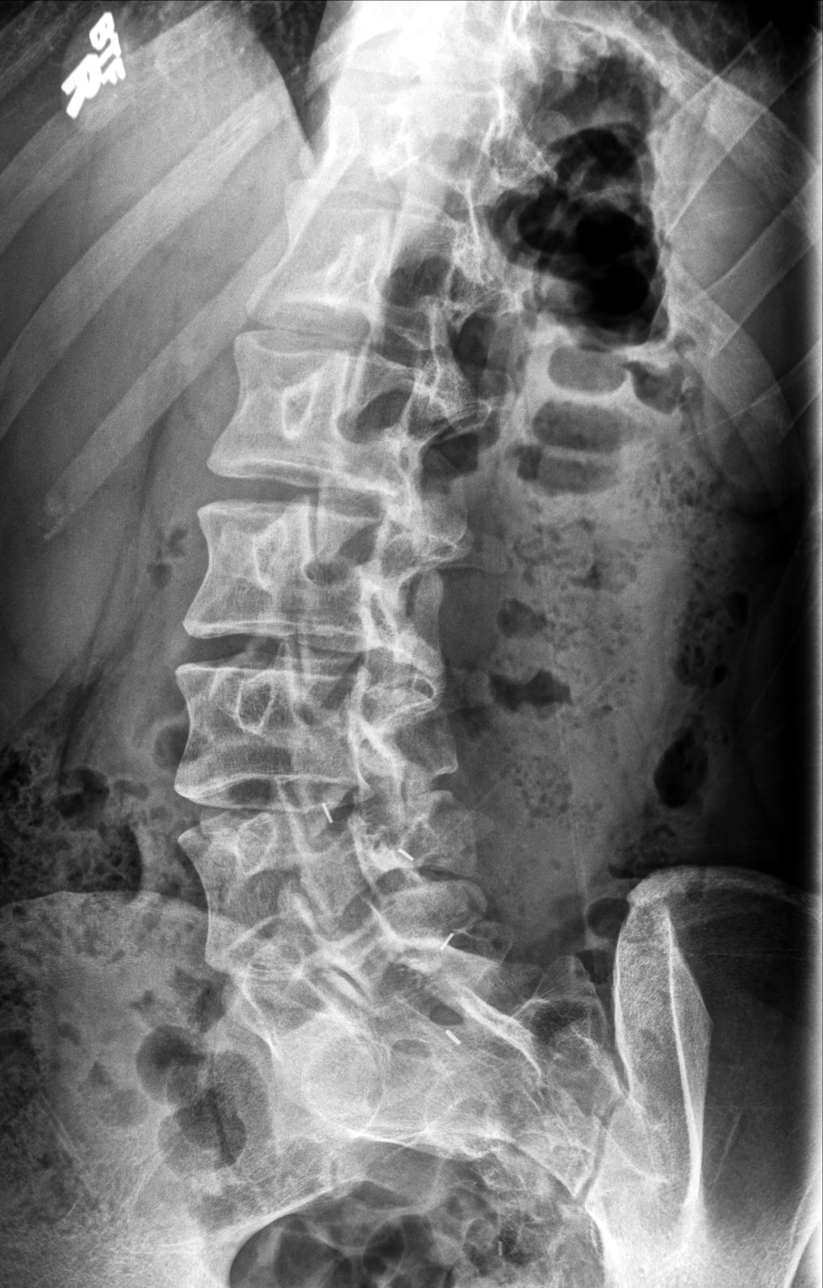

[lumbar spine oblique (2 of 2)]
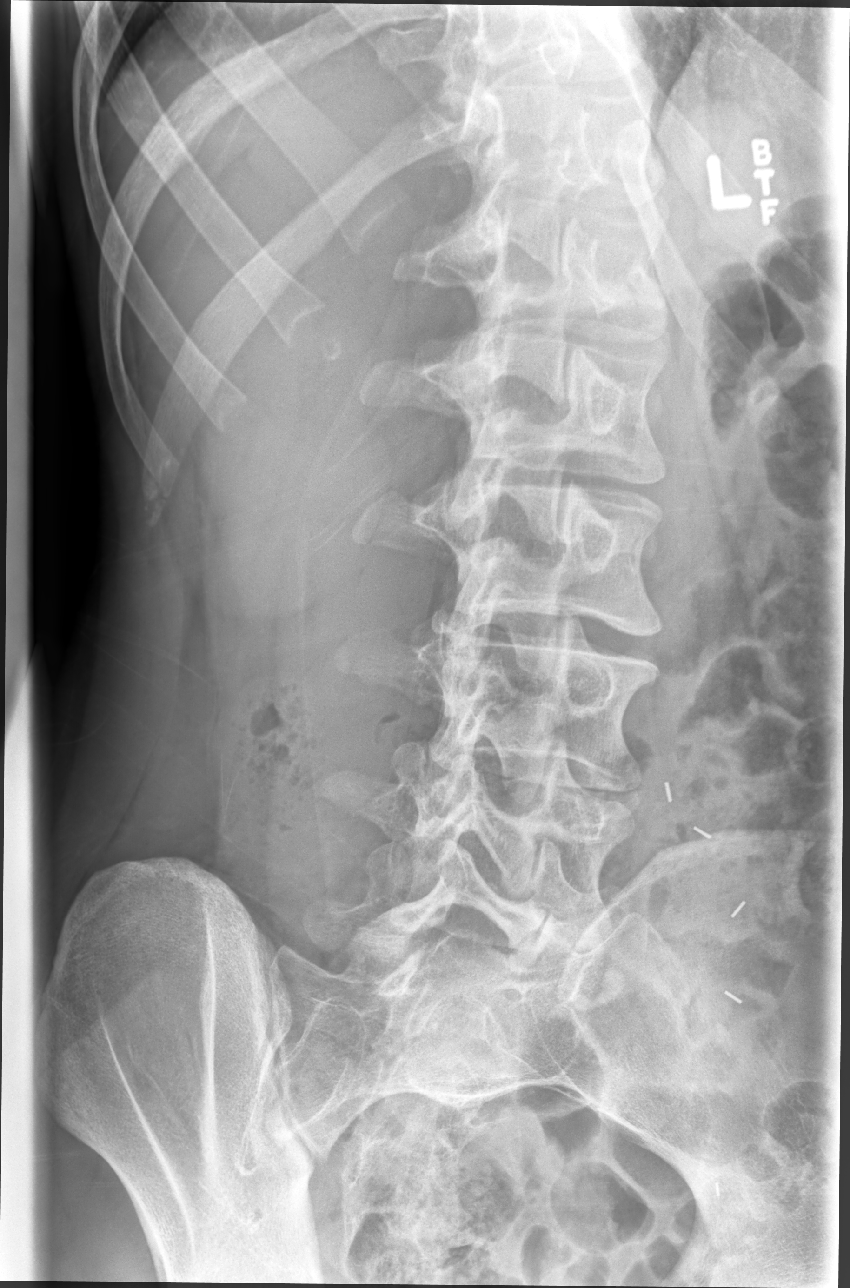

[lumbar spine lat (1 of 2)]
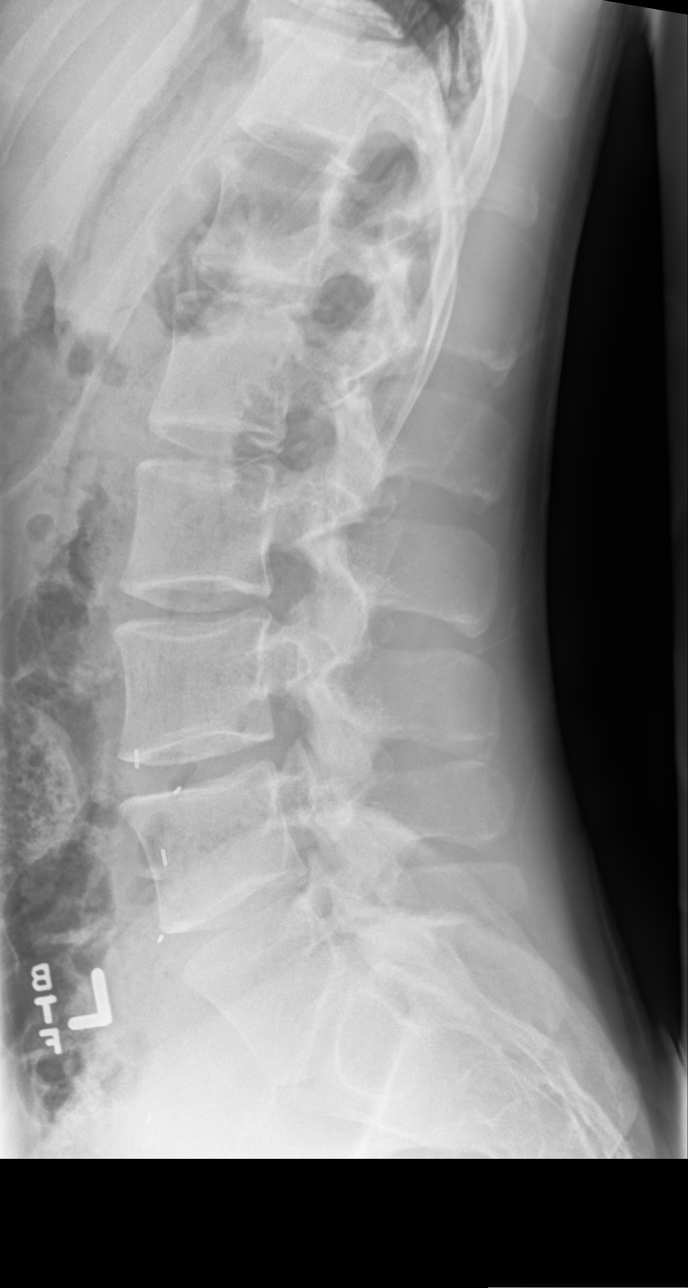

[lumbar spine lat (2 of 2)]
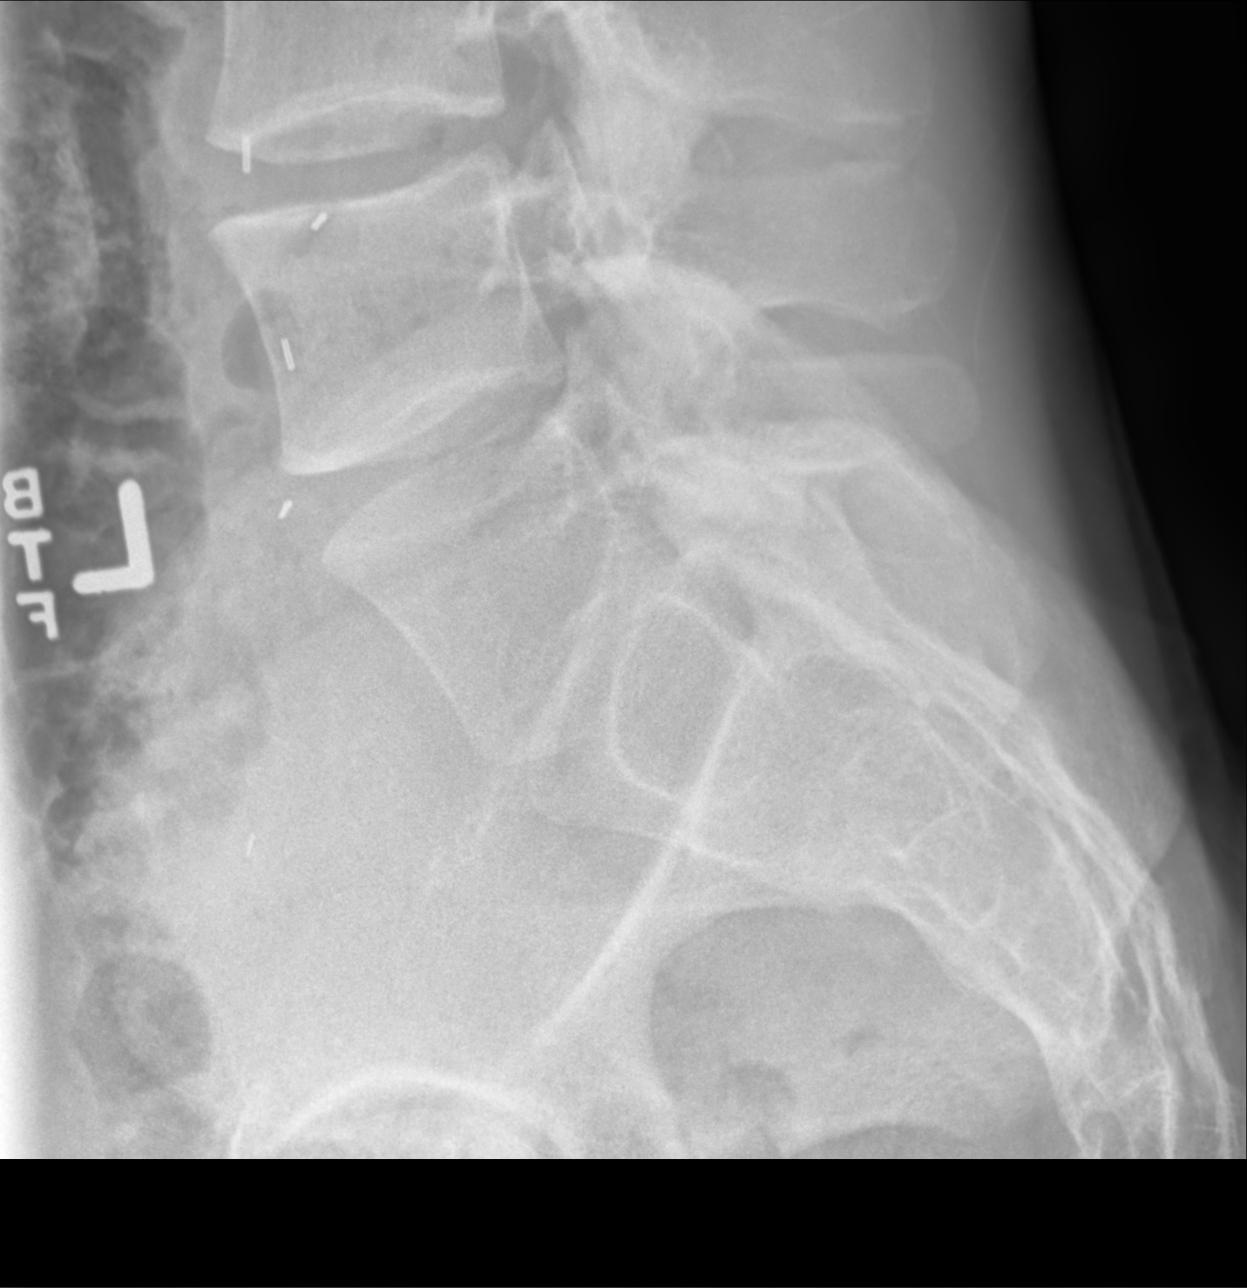

[5 of 5 positions shown; findings below may reference images not displayed]

FINDINGS: Frontal, lateral, spot lumbosacral lateral, and bilateral oblique
views were obtained. There are 6 non-rib-bearing lumbar type
vertebral bodies. There is lumbar dextroscoliosis with a rotatory
component. There is no fracture or spondylolisthesis. There is mild
disc space narrowing at L5-6 and L6-S1. There is no appreciable
facet arthropathy.
IMPRESSION: Scoliosis with mild disc space narrowing at L5-6 and L6-S1. No
fracture or spondylolisthesis.

## 2021-05-27 NOTE — Progress Notes (Signed)
Phone: [phone]    Subjective:  Patient presents today for their annual physical. Chief complaint-noted.   See problem oriented charting- ROS- full  review of systems was completed and negative per full ROS sheet  The following were reviewed and entered/updated in epic: Past Medical History:  Diagnosis Date   Acne    06/06/14- moderate inflammatory started on doxycycline 158m BID, previously on adapalene topical retinoid 2014   COVID-19 02/28/2019   GERD (gastroesophageal reflux disease)    2008-2010 diagnosis.    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 did not play contact sports.    Varicella    08/04/1998 per medical history   Varicocele    L noted 2014. could affect fertility. 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    TYMPANOSTOMY TUBE PLACEMENT     Dr. BJanace Hoard648months old    Family History  Problem Relation Age of Onset   Melanoma Mother    Multiple myeloma Mother        diagnosed 2022- multiple compression fractures   Ulcerative colitis Father    Pancreatic disease Father        noncancerous   Healthy Sister    Other Maternal Grandmother        complications after heart procedure   COPD Maternal Grandmother    Heart disease Maternal Grandmother        sounds like cad- former smoker   Healthy Maternal Grandfather     Medications- reviewed and updated No current outpatient medications on file.   No current facility-administered medications for this visit.    Allergies-reviewed and updated No Known Allergies  Social  History   Social History Narrative   Family: Single lives with roommate (cousin).  [place]. [place]        [place] time now [employer]. Remote golf. Dad exec in [place] 2021- - did [employer] for a year   Management and society degree under sociology.    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    - had gone to page.       Hobbies: fishing, golf      Objective:  BP 110/76    Pulse 70    Temp 98 F (36.7 C)    Ht 6' 2"  (1.88 m)    Wt 180 lb (81.6 kg)    SpO2 98%    BMI 23.11 kg/m  Gen: NAD, resting comfortably HEENT: Mucous membranes are moist. Oropharynx normal Neck: no thyromegaly CV: RRR no murmurs rubs or gallops Lungs: CTAB no crackles, wheeze, rhonchi Abdomen: soft/nontender/nondistended/normal bowel sounds. No rebound or guarding.  Ext: no edema Skin: warm, dry Neuro: grossly normal, moves all extremities, PERRLA    Assessment and Plan:  26y.o. male presenting for annual physical.  Health Maintenance counseling: 1. Anticipatory guidance: Patient counseled regarding  regular dental exams --q6 months, eye exams -no vision issues,  avoiding smoking and second hand smoke , limiting alcohol to 2 beverages per WRU-0-4 alcoholic beverages per week, no illicit drugs.   2. Risk factor reduction:  Advised patient of need for regular exercise and diet rich and fruits and vegetables to reduce risk of heart attack and stroke.  Exercise- exercises daily including golfing and daily workouts at the gym- has increased intensity. Doing some pickle ball. Has added muscle mass plus holiday weight Diet/weight management-reasonably healthy well balanced diet.  Wt Readings from Last 3 Encounters:  05/29/21 180 lb (81.6 kg)  05/24/20 165 lb 6.4 oz (75 kg)  05/19/19 177 lb (80.3 kg)  3. Immunizations/screenings/ancillary studies- discuss  Omicron/Bivalent booster-- opts out, HPV vaccines-opts ou, and Flu shot-opts out - otherwise immunizations are up-to-date. Immunization History  Administered Date(s) Administered   DTaP 01/25/1998, 03/14/1998, 05/15/1998, 02/11/1999, 10/11/2002   Hepatitis A 12/02/2006, 09/05/2007   Hepatitis B 05/15/1998, 08/29/1998, 11/19/1998   HiB (PRP-OMP) 01/25/1998, 03/14/1998, 02/11/1999   IPV 01/25/1998, 03/14/1998, 08/29/1998, 10/11/2002   Influenza,inj,Quad PF,6+ Mos 03/31/2017, 02/24/2018   Influenza-Unspecified 04/10/2010   Janssen (J&J) SARS-COV-2 Vaccination 08/21/2019   MMR 11/19/1998, 10/11/2002   Meningococcal Conjugate 06/11/2009, 01/04/2015   Pneumococcal-Unspecified 05/23/1999, 11/06/1999   Tdap 12/14/2008, 05/24/2020  4. Prostate cancer screening- no family history, start at age 24 5. Colon cancer screening - no family history, start at age 54. Consider early if any signs of ulcerative colitis (father with this). 6. Skin cancer screening/prevention- no dermatologist but mother did have melanoma and I have encouraged dermatology visit- he will try again this year. Advised regular sunscreen use. Denies worrisome, changing, or new skin lesions.  7. Testicular cancer screening- advised monthly self exams. Known left varicocele. 8. STD screening- patient opts out as monogamous with GF (4 years and may move in together) but she has a latex allergy - encouraged latex free condoms but declined last physical. GF on pill 9. Smoking associated screening- very rare cigar- not enough for screenings  Status of chronic or acute concerns   #Solitary kidney-left ectopic nonfunctioning kidney due to grade 4 vesicoureteral reflux.  Update CBC, CMP, UA today with labs -tylenol only if needed- avoids nsaids  #screening for hyperlipidemia-done in 2020-too soon for repeat-we will plan on 01/29/2024  #Known left varicocele- no testicular atrophy per patient.  Does not want to pursue surgical  treatment  #Congenital abnormality of lumbar spine- prior imaging with possible scoliosis and mild disc space narrowing L5-L6 and L6-S1.  Has had pain in the past and gabapentin has been helpful.  Has had visit with Dr. Paulla Fore and stretching exercises were very helpful. In fall had dropped back off and some pain but if restarts always helps- started in January and feeling great  Recommended follow up: Return in about 1 year (around 05/29/2022) for physical or sooner if needed.  Lab/Order associations: fasting   ICD-10-CM   1. Preventative health care  Z00.00 CBC with Differential/Platelet    Comprehensive metabolic panel    POCT Urinalysis Dipstick (Automated)    Hepatitis C antibody    2. Varicocele  I86.1     3. Congenital anomaly of lumbar spine  Q67.5     4. Solitary kidney, acquired  Z90.5 CBC with Differential/Platelet    Comprehensive metabolic panel    POCT Urinalysis Dipstick (Automated)    5. Encounter for hepatitis C screening test for low risk patient  Z11.59 Hepatitis C antibody  I,Harris Phan,acting as a Education administrator for Garret Reddish, MD.,have documented all relevant documentation on the behalf of Garret Reddish, MD,as directed by  Garret Reddish, MD while in the presence of Garret Reddish, MD.    I, Garret Reddish, MD, have reviewed all documentation for this visit. The documentation on 05/29/21 for the exam, diagnosis, procedures, and orders are all accurate and complete.   Return precautions advised.   Garret Reddish, MD

## 2021-05-27 NOTE — Patient Instructions (Addendum)
Declined hpv vaccination  Please stop by lab before you go If you have mychart- we will send your results within 3 business days of [place] receiving them.  If you do not have mychart- we will call you about results within 5 business days of [place] receiving them.  *please also note that you will see labs on mychart as soon as they post. I will later go in and write notes on them- will say "notes from Dr. Durene Cal"   Recommended follow up: Return in about 1 year (around 05/29/2022) for physical or sooner if needed.

## 2021-05-29 ENCOUNTER — Encounter: Payer: Self-pay | Admitting: Family Medicine

## 2021-05-29 ENCOUNTER — Other Ambulatory Visit: Payer: Self-pay

## 2021-05-29 ENCOUNTER — Ambulatory Visit (INDEPENDENT_AMBULATORY_CARE_PROVIDER_SITE_OTHER): Payer: 59 | Admitting: Family Medicine

## 2021-05-29 VITALS — BP 110/76 | HR 70 | Temp 98.0°F | Ht 74.0 in | Wt 180.0 lb

## 2021-05-29 DIAGNOSIS — Q675 Congenital deformity of spine: Secondary | ICD-10-CM

## 2021-05-29 DIAGNOSIS — I861 Scrotal varices: Secondary | ICD-10-CM | POA: Diagnosis not present

## 2021-05-29 DIAGNOSIS — Z0000 Encounter for general adult medical examination without abnormal findings: Secondary | ICD-10-CM

## 2021-05-29 DIAGNOSIS — Z905 Acquired absence of kidney: Secondary | ICD-10-CM

## 2021-05-29 DIAGNOSIS — Z1159 Encounter for screening for other viral diseases: Secondary | ICD-10-CM

## 2021-05-29 LAB — COMPREHENSIVE METABOLIC PANEL
ALT: 16 U/L (ref 0–53)
AST: 19 U/L (ref 0–37)
Albumin: 4.7 g/dL (ref 3.5–5.2)
Alkaline Phosphatase: 54 U/L (ref 39–117)
BUN: 20 mg/dL (ref 6–23)
CO2: 27 mEq/L (ref 19–32)
Calcium: 9.6 mg/dL (ref 8.4–10.5)
Chloride: 103 mEq/L (ref 96–112)
Creatinine, Ser: 1.21 mg/dL (ref 0.40–1.50)
GFR: 84.36 mL/min (ref >60.00)
Glucose, Bld: 98 mg/dL (ref 70–99)
Potassium: 4.3 mEq/L (ref 3.5–5.1)
Sodium: 140 mEq/L (ref 135–145)
Total Bilirubin: 0.6 mg/dL (ref 0.2–1.2)
Total Protein: 7.4 g/dL (ref 6.0–8.3)

## 2021-05-29 LAB — CBC WITH DIFFERENTIAL/PLATELET
Basophils Absolute: 0 10*3/uL (ref 0.0–0.1)
Basophils Relative: 1.2 % (ref 0.0–3.0)
Eosinophils Absolute: 0 10*3/uL (ref 0.0–0.7)
Eosinophils Relative: 1.5 % (ref 0.0–5.0)
HCT: 43.9 % (ref 39.0–52.0)
Hemoglobin: 14.8 g/dL (ref 13.0–17.0)
Lymphocytes Relative: 38.8 % (ref 12.0–46.0)
Lymphs Abs: 1.3 10*3/uL (ref 0.7–4.0)
MCHC: 33.7 g/dL (ref 30.0–36.0)
MCV: 85.1 fl (ref 78.0–100.0)
Monocytes Absolute: 0.3 10*3/uL (ref 0.1–1.0)
Monocytes Relative: 8.3 % (ref 3.0–12.0)
Neutro Abs: 1.7 10*3/uL (ref 1.4–7.7)
Neutrophils Relative %: 50.2 % (ref 43.0–77.0)
Platelets: 159 10*3/uL (ref 150.0–400.0)
RBC: 5.16 Mil/uL (ref 4.22–5.81)
RDW: 13.3 % (ref 11.5–15.5)
WBC: 3.3 10*3/uL — ABNORMAL LOW (ref 4.0–10.5)

## 2021-05-29 LAB — POC URINALSYSI DIPSTICK (AUTOMATED)
Bilirubin, UA: NEGATIVE
Blood, UA: NEGATIVE
Glucose, UA: NEGATIVE
Ketones, UA: NEGATIVE
Leukocytes, UA: NEGATIVE
Nitrite, UA: NEGATIVE
Protein, UA: NEGATIVE
Spec Grav, UA: 1.025 (ref 1.010–1.025)
Urobilinogen, UA: 0.2 E.U./dL
pH, UA: 6 (ref 5.0–8.0)

## 2021-05-30 LAB — HEPATITIS C ANTIBODY
Hepatitis C Ab: NONREACTIVE
SIGNAL TO CUT-OFF: 0.15 (ref <1.00)

## 2022-02-02 ENCOUNTER — Encounter: Payer: Self-pay | Admitting: *Deleted

## 2022-04-23 ENCOUNTER — Encounter: Payer: Self-pay | Admitting: *Deleted

## 2022-06-01 ENCOUNTER — Encounter: Payer: Self-pay | Admitting: Family Medicine

## 2022-06-01 ENCOUNTER — Ambulatory Visit (INDEPENDENT_AMBULATORY_CARE_PROVIDER_SITE_OTHER): Payer: 59 | Admitting: Family Medicine

## 2022-06-01 VITALS — BP 100/70 | HR 51 | Temp 97.3°F | Ht 74.0 in | Wt 189.0 lb

## 2022-06-01 DIAGNOSIS — Z0000 Encounter for general adult medical examination without abnormal findings: Secondary | ICD-10-CM | POA: Diagnosis not present

## 2022-06-01 DIAGNOSIS — Z905 Acquired absence of kidney: Secondary | ICD-10-CM | POA: Diagnosis not present

## 2022-06-01 LAB — URINALYSIS, ROUTINE W REFLEX MICROSCOPIC
Bilirubin Urine: NEGATIVE
Hgb urine dipstick: NEGATIVE
Ketones, ur: NEGATIVE
Leukocytes,Ua: NEGATIVE
Nitrite: NEGATIVE
RBC / HPF: NONE SEEN (ref 0–?)
Specific Gravity, Urine: 1.02 (ref 1.000–1.030)
Total Protein, Urine: NEGATIVE
Urine Glucose: NEGATIVE
Urobilinogen, UA: 0.2 (ref 0.0–1.0)
pH: 6.5 (ref 5.0–8.0)

## 2022-06-01 LAB — COMPREHENSIVE METABOLIC PANEL
ALT: 12 U/L (ref 0–53)
AST: 16 U/L (ref 0–37)
Albumin: 4.7 g/dL (ref 3.5–5.2)
Alkaline Phosphatase: 57 U/L (ref 39–117)
BUN: 17 mg/dL (ref 6–23)
CO2: 27 mEq/L (ref 19–32)
Calcium: 9.4 mg/dL (ref 8.4–10.5)
Chloride: 103 mEq/L (ref 96–112)
Creatinine, Ser: 0.98 mg/dL (ref 0.40–1.50)
GFR: 107.88 mL/min (ref >60.00)
Glucose, Bld: 94 mg/dL (ref 70–99)
Potassium: 4.1 mEq/L (ref 3.5–5.1)
Sodium: 137 mEq/L (ref 135–145)
Total Bilirubin: 0.6 mg/dL (ref 0.2–1.2)
Total Protein: 7.3 g/dL (ref 6.0–8.3)

## 2022-06-01 LAB — CBC WITH DIFFERENTIAL/PLATELET
Basophils Absolute: 0 10*3/uL (ref 0.0–0.1)
Basophils Relative: 1.1 % (ref 0.0–3.0)
Eosinophils Absolute: 0.1 10*3/uL (ref 0.0–0.7)
Eosinophils Relative: 1.8 % (ref 0.0–5.0)
HCT: 43.1 % (ref 39.0–52.0)
Hemoglobin: 14.6 g/dL (ref 13.0–17.0)
Lymphocytes Relative: 39.5 % (ref 12.0–46.0)
Lymphs Abs: 1.3 10*3/uL (ref 0.7–4.0)
MCHC: 33.9 g/dL (ref 30.0–36.0)
MCV: 85.6 fl (ref 78.0–100.0)
Monocytes Absolute: 0.2 10*3/uL (ref 0.1–1.0)
Monocytes Relative: 7.4 % (ref 3.0–12.0)
Neutro Abs: 1.6 10*3/uL (ref 1.4–7.7)
Neutrophils Relative %: 50.2 % (ref 43.0–77.0)
Platelets: 166 10*3/uL (ref 150.0–400.0)
RBC: 5.03 Mil/uL (ref 4.22–5.81)
RDW: 13.1 % (ref 11.5–15.5)
WBC: 3.2 10*3/uL — ABNORMAL LOW (ref 4.0–10.5)

## 2022-06-01 NOTE — Progress Notes (Signed)
Phone: [phone]    Subjective:  Patient presents today for their annual physical. Chief complaint-noted.   See problem oriented charting- ROS- full  review of systems was completed and negative  except for: intermittent back issues- stretches still help- if misses it bothers him.   The following were reviewed and entered/updated in epic: Past Medical History:  Diagnosis Date   Acne    06/06/14- moderate inflammatory started on doxycycline 100mg  BID, previously on adapalene topical retinoid 2014   COVID-19 02/28/2019   GERD (gastroesophageal reflux disease)    2008-2010 diagnosis.    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 did not play contact sports.    Varicella    08/04/1998 per medical history   Varicocele    L noted 2014. could affect fertility. 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    TYMPANOSTOMY TUBE PLACEMENT     Dr. Janace Hoard 35 months old    Family History  Problem Relation Age of Onset   Melanoma Mother    Multiple myeloma Mother        diagnosed 2022- multiple compression fractures   Ulcerative colitis Father    Pancreatic disease Father        noncancerous   Healthy Sister    Other Maternal Grandmother        complications after heart procedure   COPD Maternal Grandmother    Heart disease Maternal Grandmother        sounds like cad- former smoker   Healthy Maternal Grandfather     Medications- reviewed and updated No current outpatient medications on file.   No current facility-administered medications for this  visit.    Allergies-reviewed and updated No Known Allergies  Social History   Social History Narrative   Family: Lives with GF Alexa- together for over 4 years in 2024. Sister lives in [place].        Moved to [place]- working full time now [employer] for golf. Remote golf. Dad exec in [place] spring 2021- - did [employer] for a year   Management and society degree under sociology.    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    - had gone to page.       Hobbies: fishing, golf      Objective:  BP 100/70   Pulse (!) 51   Temp (!) 97.3 F (36.3 C)   Ht 6\' 2"  (1.88 m)   Wt 189 lb (85.7 kg)   SpO2 99%   BMI 24.27 kg/m  Gen: NAD, resting comfortably HEENT: Mucous membranes are moist. Oropharynx normal Neck: no thyromegaly CV: bradycardic but regular no murmurs rubs or gallops Lungs: CTAB no crackles, wheeze, rhonchi Abdomen: soft/nontender/nondistended/normal bowel sounds. No rebound or guarding.  Ext: no edema Skin: warm, dry Neuro: grossly normal, moves all extremities, PERRLA    Assessment and Plan:  27 y.o. male presenting for annual physical.  Health Maintenance counseling: 1.  Anticipatory guidance: Patient counseled regarding regular dental exams -q6 months, eye exams - no vision issues,  avoiding smoking and second hand smoke- maybe 1 cigar in last year- definitely cut , limiting alcohol to 2 beverages per day- 2-5 per week, no illicit drugs.   2. Risk factor reduction:  Advised patient of need for regular exercise and diet rich and fruits and vegetables to reduce risk of heart attack and stroke.  Exercise- continues regular exercise x 7 days a week - gym 4-5 days a week, running, pickleball.  Diet/weight management- weight up 9 lbs - but has gained muscle mass and healthy weight still.  Wt Readings from Last 3 Encounters:  06/01/22 189 lb (85.7 kg)  05/29/21 180 lb (81.6 kg)  05/24/20 165 lb 6.4 oz (75  kg)  3. Immunizations/screenings/ancillary studies- opts out of HPV vaccine, flu shot, covid shot Immunization History  Administered Date(s) Administered   DTaP 01/25/1998, 03/14/1998, 05/15/1998, 02/11/1999, 10/11/2002   HIB (PRP-OMP) 01/25/1998, 03/14/1998, 02/11/1999   Hepatitis A 12/02/2006, 09/05/2007   Hepatitis B 05/15/1998, 08/29/1998, 11/19/1998   IPV 01/25/1998, 03/14/1998, 08/29/1998, 10/11/2002   Influenza,inj,Quad PF,6+ Mos 03/31/2017, 02/24/2018   Influenza-Unspecified 04/10/2010   Janssen (J&J) SARS-COV-2 Vaccination 08/21/2019   MMR 11/19/1998, 10/11/2002   Meningococcal Conjugate 06/11/2009, 01/04/2015   Pneumococcal-Unspecified 05/23/1999, 11/06/1999   Tdap 12/14/2008, 05/24/2020   4. Prostate cancer screening- no family history, start at age 70    5. Colon cancer screening - no family history, start at age 53. Dad with ulcerative colitis- if any signs get early colonoscopy- no blood in stool or abdominal cramping 6. Skin cancer screening/prevention- recommended dermatology last year with mom with melanoma history- on books for this summer [facility] dermat[facility]. advised regular sunscreen use. Denies worrisome, changing, or new skin lesions.  7. Testicular cancer screening- advised monthly self exams - known left varicocele 8. STD screening- patient opts  out- still with GF of 4 years. GF with IUD 9. Smoking associated screening- never smoker other than intermittent cigars- encouraged against- and he has cut down to 1 a year or so  Status of chronic or acute concerns   # solitary kidney -left ectopic nonfunctioning kidney due to grade 4 vesicoureteral reflux.  Update CBC, CMP, UA -This will also give [place] a chance to check in on mild leukopenia last year and year before  # Screening hyperlipidemia-last done in 2020-offered repeat but small chance would not be covered- he opted out- check next year   #Known left varicocele-no testicular atrophy per patient-does not want to  pursue surgical treatment - not concerned about fertility issues  # Congenital abnormality of the lumbar spine extra vertebrae -has worked with Dr. Paulla Fore in the past and required gabapentin at times.  Stretches have remained very helpful   Recommended follow up: Return in about 1 year (around 06/02/2023) for physical or sooner if needed.Schedule b4 you leave.  Lab/Order associations: fasting   ICD-10-CM   1. Preventative health care  Z00.00 CBC with Differential/Platelet    Comprehensive metabolic panel    Urinalysis, Routine w reflex microscopic    2. Solitary kidney, acquired  Z90.5 CBC with Differential/Platelet    Comprehensive metabolic panel    Urinalysis, Routine w reflex microscopic     Return precautions advised.   Garret Reddish, MD

## 2022-06-01 NOTE — Patient Instructions (Addendum)
Labs in room Also needs sugary drink if we have any OR water with snacks If you have mychart- we will send your results within 3 business days of [place] receiving them.  If you do not have mychart- we will call you about results within 5 business days of [place] receiving them.  *please also note that you will see labs on mychart as soon as they post. I will later go in and write notes on them- will say "notes from Dr. Yong Channel"   Recommended follow up: Return in about 1 year (around 06/02/2023) for physical or sooner if needed.Schedule b4 you leave.

## 2023-06-07 ENCOUNTER — Encounter: Payer: Self-pay | Admitting: Family Medicine

## 2023-06-07 ENCOUNTER — Ambulatory Visit (INDEPENDENT_AMBULATORY_CARE_PROVIDER_SITE_OTHER): Payer: 59 | Admitting: Family Medicine

## 2023-06-07 VITALS — BP 100/70 | HR 75 | Ht 74.0 in | Wt 192.0 lb

## 2023-06-07 DIAGNOSIS — Z0000 Encounter for general adult medical examination without abnormal findings: Secondary | ICD-10-CM | POA: Diagnosis not present

## 2023-06-07 DIAGNOSIS — Z130 Encounter for screening for diseases of the blood and blood-forming organs and certain disorders involving the immune mechanism: Secondary | ICD-10-CM

## 2023-06-07 DIAGNOSIS — Z905 Acquired absence of kidney: Secondary | ICD-10-CM | POA: Diagnosis not present

## 2023-06-07 DIAGNOSIS — Z13220 Encounter for screening for lipoid disorders: Secondary | ICD-10-CM

## 2023-06-07 LAB — CBC WITH DIFFERENTIAL/PLATELET
Basophils Absolute: 0 10*3/uL (ref 0.0–0.1)
Basophils Relative: 0.9 % (ref 0.0–3.0)
Eosinophils Absolute: 0.1 10*3/uL (ref 0.0–0.7)
Eosinophils Relative: 2.7 % (ref 0.0–5.0)
HCT: 43.9 % (ref 39.0–52.0)
Hemoglobin: 14.8 g/dL (ref 13.0–17.0)
Lymphocytes Relative: 38.3 % (ref 12.0–46.0)
Lymphs Abs: 1.3 10*3/uL (ref 0.7–4.0)
MCHC: 33.7 g/dL (ref 30.0–36.0)
MCV: 85.2 fL (ref 78.0–100.0)
Monocytes Absolute: 0.3 10*3/uL (ref 0.1–1.0)
Monocytes Relative: 9.1 % (ref 3.0–12.0)
Neutro Abs: 1.7 10*3/uL (ref 1.4–7.7)
Neutrophils Relative %: 49 % (ref 43.0–77.0)
Platelets: 175 10*3/uL (ref 150.0–400.0)
RBC: 5.15 Mil/uL (ref 4.22–5.81)
RDW: 13.2 % (ref 11.5–15.5)
WBC: 3.4 10*3/uL — ABNORMAL LOW (ref 4.0–10.5)

## 2023-06-07 LAB — COMPREHENSIVE METABOLIC PANEL
ALT: 15 U/L (ref 0–53)
AST: 17 U/L (ref 0–37)
Albumin: 4.5 g/dL (ref 3.5–5.2)
Alkaline Phosphatase: 51 U/L (ref 39–117)
BUN: 16 mg/dL (ref 6–23)
CO2: 27 meq/L (ref 19–32)
Calcium: 9.2 mg/dL (ref 8.4–10.5)
Chloride: 103 meq/L (ref 96–112)
Creatinine, Ser: 1.08 mg/dL (ref 0.40–1.50)
GFR: 95.32 mL/min (ref >60.00)
Glucose, Bld: 94 mg/dL (ref 70–99)
Potassium: 3.7 meq/L (ref 3.5–5.1)
Sodium: 138 meq/L (ref 135–145)
Total Bilirubin: 1.1 mg/dL (ref 0.2–1.2)
Total Protein: 7 g/dL (ref 6.0–8.3)

## 2023-06-07 LAB — URINALYSIS, ROUTINE W REFLEX MICROSCOPIC
Bilirubin Urine: NEGATIVE
Hgb urine dipstick: NEGATIVE
Ketones, ur: NEGATIVE
Leukocytes,Ua: NEGATIVE
Nitrite: NEGATIVE
RBC / HPF: NONE SEEN (ref 0–?)
Specific Gravity, Urine: 1.015 (ref 1.000–1.030)
Total Protein, Urine: NEGATIVE
Urine Glucose: NEGATIVE
Urobilinogen, UA: 0.2 (ref 0.0–1.0)
WBC, UA: NONE SEEN (ref 0–?)
pH: 6 (ref 5.0–8.0)

## 2023-06-07 LAB — LIPID PANEL
Cholesterol: 168 mg/dL (ref 0–200)
HDL: 64.4 mg/dL (ref >39.00)
LDL Cholesterol: 87 mg/dL (ref 0–99)
NonHDL: 103.93
Total CHOL/HDL Ratio: 3
Triglycerides: 84 mg/dL (ref 0.0–149.0)
VLDL: 16.8 mg/dL (ref 0.0–40.0)

## 2023-06-07 NOTE — Patient Instructions (Addendum)
Please stop by lab before you go If you have mychart- we will send your results within 3 business days of [place] receiving them.  If you do not have mychart- we will call you about results within 5 business days of [place] receiving them.  *please also note that you will see labs on mychart as soon as they post. I will later go in and write notes on them- will say "notes from Dr. Durene Cal"   Recommended follow up: Return in about 1 year (around 06/06/2024) for physical or sooner if needed.Schedule b4 you leave.

## 2023-06-07 NOTE — Progress Notes (Signed)
Phone: [phone]    Subjective:  Patient presents today for their annual physical. Chief complaint-noted.   See problem oriented charting- ROS- full  review of systems was completed and negative  Per full ROS sheet completed by patient  The following were reviewed and entered/updated in epic: Past Medical History:  Diagnosis Date   Acne    06/06/14- moderate inflammatory started on doxycycline 100mg  BID, previously on adapalene topical retinoid 2014   COVID-19 02/28/2019   GERD (gastroesophageal reflux disease)    2008-2010 diagnosis.    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 did not play contact sports.    Varicella    08/04/1998 per medical history   Varicocele    L noted 2014. could affect fertility. 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    TYMPANOSTOMY TUBE PLACEMENT     Dr. Jearld Fenton 6 months old    Family History  Problem Relation Age of Onset   Melanoma Mother    Multiple myeloma Mother        diagnosed 2022- multiple compression fractures   Ulcerative colitis Father    Pancreatic disease Father        noncancerous   Healthy Sister    Other Maternal Grandmother        complications after heart procedure   COPD Maternal Grandmother    Heart disease Maternal Grandmother        sounds like cad- former smoker   Healthy Maternal Grandfather     Medications- reviewed and updated No current outpatient medications on file.   No current facility-administered medications for this visit.    Allergies-reviewed and updated No  Known Allergies  Social History   Social History Narrative   Family: Lives with fiancee Alexa- together for over 5 years in 2025. Sister lives in [place].        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    Prior in [place]- working full time now [employer] for golf. Remote golf. Dad exec in company   Graduated [employer] spring 2021- - did [employer] for a year   Management and society degree under sociology.    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    - had gone to page.       Hobbies: fishing, golf      Objective:  BP 100/70   Pulse 75   Ht 6\' 2"  (1.88 m)   Wt 192 lb (87.1 kg)   SpO2 98%   BMI 24.65 kg/m  Gen: NAD, resting comfortably HEENT: Mucous membranes are moist. Oropharynx normal Neck: no thyromegaly CV: RRR no murmurs rubs or gallops Lungs: CTAB no crackles, wheeze, rhonchi Abdomen: soft/nontender/nondistended/normal bowel sounds. No rebound or guarding.  Ext: no edema Skin: warm, dry Neuro: grossly normal, moves all extremities, PERRLA Declines rectal and genitourinary exam    Assessment and Plan:  28 y.o. male presenting for annual physical.  Health Maintenance counseling: 1.  Anticipatory guidance: Patient counseled regarding regular dental exams -q6 months, eye exams -no vision issues or changes,  avoiding smoking and second hand smoke , limiting alcohol to 2 beverages per day- 2-5 per week, no illicit drugs.   2. Risk factor reduction:  Advised patient of need for regular exercise and diet rich and fruits and vegetables to reduce risk of heart attack and stroke.  Exercise- 5-7 days a week.  Diet/weight management-up 3 pounds from last year but still healthy weight and exercising regularly.  Wt Readings from Last 3 Encounters:  06/07/23 192 lb (87.1 kg)  06/01/22 189 lb (85.7 kg)  05/29/21 180 lb (81.6 kg)  3. Immunizations/screenings/ancillary studies-opts out of COVID, flu, HPV once again   Immunization History  Administered Date(s) Administered   DTaP 01/25/1998, 03/14/1998, 05/15/1998, 02/11/1999, 10/11/2002   HIB (PRP-OMP) 01/25/1998, 03/14/1998, 02/11/1999   Hepatitis A 12/02/2006, 09/05/2007   Hepatitis B 05/15/1998, 08/29/1998, 11/19/1998   IPV 01/25/1998, 03/14/1998, 08/29/1998, 10/11/2002   Influenza,inj,Quad PF,6+ Mos 03/31/2017, 02/24/2018   Influenza-Unspecified 04/10/2010   Janssen (J&J) SARS-COV-2 Vaccination 08/21/2019   MMR 11/19/1998, 10/11/2002   Meningococcal Conjugate 06/11/2009, 01/04/2015   Pneumococcal-Unspecified 05/23/1999, 11/06/1999   Tdap 12/14/2008, 05/24/2020  4. Prostate cancer screening-  no family history, start at age 64   5. Colon cancer screening - father with ulcerative colitis.  Have discussed the pain sinus to consider early colonoscopy but no blood in stool or abdominal cramping reported. No family history of colon cancer specifically, start at age 50  6. Skin cancer screening/prevention-with mom's melanoma history he saw dermatology last year- [facility]- was told only needed to do as needed visits - he will monitor for skin changes. advised regular sunscreen use. Denies worrisome, changing, or new skin lesions.  7. Testicular cancer screening- advised monthly self exams -known left varicocele-  hasn't noticed lately 8. STD screening- patient opts out-still with fiancee now for about 5 years- she has IUD 9. Smoking associated screening-never smoker- intermittent cigars in the past and I recommended against these- has not used these much- maybe one in last year  Status of chronic or acute concerns   #social update- ran half marathon in [place]- had some pain in top of left foot- took 2 months out and ran recently and no issues. Not sure he will do it again.   # Solitary kidney-left ectopic nonfunctioning kidney due to grade 4 vesicoureteral reflux -Check CBC, CMP, UA yearly  # Known left varicocele-no testicular atrophy  per patient and does not want to pursue surgical treatment-he is not concerned about possible fertility issues. Actually reports smaller   # congenital Abnormality lumbar spine extra vertebrae-has worked with Dr. Berline Chough in the past required gabapentin at times.  Stretches remain helpful- as long as consistenet    Recommended follow up: Return in about 1 year (around 06/06/2024) for physical or sooner if needed.Schedule b4 you leave.  Lab/Order associations: fasting   ICD-10-CM   1. Preventative health care  Z00.00     2. Screening for hyperlipidemia  Z13.220 Comprehensive metabolic panel    Lipid panel    3. Screening for deficiency anemia  Z13.0 CBC with Differential/Platelet    4. Solitary kidney, acquired  Z90.5 Urinalysis, Routine w reflex microscopic      No orders of the defined types were placed in this encounter.   Return precautions advised.   Tana Conch, MD

## 2023-12-06 ENCOUNTER — Encounter: Payer: Self-pay | Admitting: Family Medicine

## 2024-06-09 ENCOUNTER — Telehealth: Payer: Self-pay | Admitting: Family Medicine

## 2024-06-09 NOTE — Telephone Encounter (Signed)
 LVM to r/s appt for 06/12/24. Please transfer to [facility] if pt requests to be seen sooner

## 2024-06-12 ENCOUNTER — Encounter: Payer: 59 | Admitting: Family Medicine

## 2024-07-07 ENCOUNTER — Encounter: Admitting: Family Medicine

## 2024-10-13 ENCOUNTER — Encounter: Admitting: Family Medicine
